# Patient Record
Sex: Female | Born: 1975
Health system: Southern US, Community
[De-identification: ages and names within clinical notes are randomized; demographics above are authoritative.]

## PROBLEM LIST (undated history)

## (undated) DIAGNOSIS — K219 Gastro-esophageal reflux disease without esophagitis: Secondary | ICD-10-CM

## (undated) DIAGNOSIS — Z8619 Personal history of other infectious and parasitic diseases: Secondary | ICD-10-CM

## (undated) HISTORY — DX: Personal history of other infectious and parasitic diseases: Z86.19

## (undated) HISTORY — PX: WISDOM TOOTH EXTRACTION: SHX21

## (undated) HISTORY — DX: Gastro-esophageal reflux disease without esophagitis: K21.9

---

## 2001-11-14 ENCOUNTER — Other Ambulatory Visit: Admission: RE | Admit: 2001-11-14 | Discharge: 2001-11-14 | Payer: Self-pay | Admitting: Obstetrics and Gynecology

## 2001-12-25 ENCOUNTER — Encounter: Payer: Self-pay | Admitting: Obstetrics and Gynecology

## 2001-12-25 ENCOUNTER — Ambulatory Visit (HOSPITAL_COMMUNITY): Admission: RE | Admit: 2001-12-25 | Discharge: 2001-12-25 | Payer: Self-pay | Admitting: Obstetrics and Gynecology

## 2002-01-21 ENCOUNTER — Encounter: Payer: Self-pay | Admitting: Obstetrics and Gynecology

## 2002-01-21 ENCOUNTER — Ambulatory Visit (HOSPITAL_COMMUNITY): Admission: RE | Admit: 2002-01-21 | Discharge: 2002-01-21 | Payer: Self-pay | Admitting: Obstetrics and Gynecology

## 2002-05-19 ENCOUNTER — Inpatient Hospital Stay (HOSPITAL_COMMUNITY): Admission: AD | Admit: 2002-05-19 | Discharge: 2002-05-22 | Payer: Self-pay | Admitting: Obstetrics and Gynecology

## 2002-05-23 ENCOUNTER — Encounter: Admission: RE | Admit: 2002-05-23 | Discharge: 2002-06-22 | Payer: Self-pay | Admitting: Obstetrics and Gynecology

## 2002-11-27 ENCOUNTER — Other Ambulatory Visit: Admission: RE | Admit: 2002-11-27 | Discharge: 2002-11-27 | Payer: Self-pay | Admitting: Obstetrics and Gynecology

## 2003-12-24 ENCOUNTER — Other Ambulatory Visit: Admission: RE | Admit: 2003-12-24 | Discharge: 2003-12-24 | Payer: Self-pay | Admitting: Obstetrics and Gynecology

## 2004-06-03 ENCOUNTER — Ambulatory Visit: Payer: Self-pay | Admitting: Professional

## 2004-06-14 ENCOUNTER — Ambulatory Visit: Payer: Self-pay | Admitting: Professional

## 2004-06-21 ENCOUNTER — Ambulatory Visit: Payer: Self-pay | Admitting: Professional

## 2004-06-28 ENCOUNTER — Ambulatory Visit: Payer: Self-pay | Admitting: Professional

## 2004-07-05 ENCOUNTER — Ambulatory Visit: Payer: Self-pay | Admitting: Professional

## 2004-07-12 ENCOUNTER — Ambulatory Visit: Payer: Self-pay | Admitting: Professional

## 2004-07-22 ENCOUNTER — Ambulatory Visit: Payer: Self-pay | Admitting: Professional

## 2005-01-07 ENCOUNTER — Other Ambulatory Visit: Admission: RE | Admit: 2005-01-07 | Discharge: 2005-01-07 | Payer: Self-pay | Admitting: Obstetrics and Gynecology

## 2011-03-07 ENCOUNTER — Encounter: Payer: Self-pay | Admitting: *Deleted

## 2011-03-07 ENCOUNTER — Emergency Department (HOSPITAL_BASED_OUTPATIENT_CLINIC_OR_DEPARTMENT_OTHER)
Admission: EM | Admit: 2011-03-07 | Discharge: 2011-03-07 | Disposition: A | Discharge status: Home / Self Care | Payer: No Typology Code available for payment source | Attending: Emergency Medicine | Admitting: Emergency Medicine

## 2011-03-07 DIAGNOSIS — Y92410 Unspecified street and highway as the place of occurrence of the external cause: Secondary | ICD-10-CM | POA: Insufficient documentation

## 2011-03-07 DIAGNOSIS — M549 Dorsalgia, unspecified: Secondary | ICD-10-CM | POA: Insufficient documentation

## 2011-03-07 MED ORDER — ONDANSETRON 4 MG PO TBDP
4.0000 mg | ORAL_TABLET | Freq: Once | ORAL | Status: AC
  Administered 2011-03-07: 4 mg via ORAL
  Filled 2011-03-07: qty 1

## 2011-03-07 MED ORDER — HYDROCODONE-ACETAMINOPHEN 5-500 MG PO TABS: 1.0000 | ORAL_TABLET | Freq: Four times a day (QID) | ORAL | Status: AC | PRN

## 2011-03-07 MED ORDER — CYCLOBENZAPRINE HCL 5 MG PO TABS: 5.0000 mg | ORAL_TABLET | Freq: Two times a day (BID) | ORAL | Status: AC | PRN
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
## 2014-08-18 ENCOUNTER — Emergency Department (HOSPITAL_BASED_OUTPATIENT_CLINIC_OR_DEPARTMENT_OTHER)
Admission: EM | Admit: 2014-08-18 | Discharge: 2014-08-19 | Disposition: A | Discharge status: Home / Self Care | Payer: No Typology Code available for payment source | Attending: Emergency Medicine | Admitting: Emergency Medicine

## 2014-08-18 ENCOUNTER — Encounter (HOSPITAL_BASED_OUTPATIENT_CLINIC_OR_DEPARTMENT_OTHER): Payer: Self-pay | Admitting: *Deleted

## 2014-08-18 DIAGNOSIS — Y9389 Activity, other specified: Secondary | ICD-10-CM | POA: Diagnosis not present

## 2014-08-18 DIAGNOSIS — S0990XA Unspecified injury of head, initial encounter: Secondary | ICD-10-CM | POA: Diagnosis not present

## 2014-08-18 DIAGNOSIS — Y998 Other external cause status: Secondary | ICD-10-CM | POA: Diagnosis not present

## 2014-08-18 DIAGNOSIS — S199XXA Unspecified injury of neck, initial encounter: Secondary | ICD-10-CM | POA: Diagnosis not present

## 2014-08-18 DIAGNOSIS — Z87891 Personal history of nicotine dependence: Secondary | ICD-10-CM | POA: Diagnosis not present

## 2014-08-18 DIAGNOSIS — S3992XA Unspecified injury of lower back, initial encounter: Secondary | ICD-10-CM | POA: Insufficient documentation

## 2014-08-18 DIAGNOSIS — Z880 Allergy status to penicillin: Secondary | ICD-10-CM | POA: Diagnosis not present

## 2014-08-18 DIAGNOSIS — Y92410 Unspecified street and highway as the place of occurrence of the external cause: Secondary | ICD-10-CM | POA: Diagnosis not present

## 2014-08-18 DIAGNOSIS — Z3202 Encounter for pregnancy test, result negative: Secondary | ICD-10-CM | POA: Diagnosis not present
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
## 2014-08-19 ENCOUNTER — Emergency Department (HOSPITAL_BASED_OUTPATIENT_CLINIC_OR_DEPARTMENT_OTHER): Payer: No Typology Code available for payment source

## 2014-08-19 LAB — PREGNANCY, URINE: Preg Test, Ur: NEGATIVE

## 2014-08-19 MED ORDER — HYDROCODONE-ACETAMINOPHEN 5-325 MG PO TABS: 1.0000 | ORAL_TABLET | Freq: Four times a day (QID) | ORAL | Status: DC | PRN

## 2014-08-19 NOTE — Discharge Instructions (Signed)
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
## 2016-05-02 ENCOUNTER — Encounter: Payer: Self-pay | Admitting: Physician Assistant

## 2016-05-02 ENCOUNTER — Ambulatory Visit (INDEPENDENT_AMBULATORY_CARE_PROVIDER_SITE_OTHER): Payer: BLUE CROSS/BLUE SHIELD | Admitting: Physician Assistant

## 2016-05-02 VITALS — BP 112/80 | HR 73 | Temp 98.6°F | Resp 14 | Ht 65.25 in | Wt 199.0 lb

## 2016-05-02 DIAGNOSIS — Z0001 Encounter for general adult medical examination with abnormal findings: Secondary | ICD-10-CM | POA: Diagnosis not present

## 2016-05-02 DIAGNOSIS — G8929 Other chronic pain: Secondary | ICD-10-CM | POA: Diagnosis not present

## 2016-05-02 DIAGNOSIS — Z0000 Encounter for general adult medical examination without abnormal findings: Secondary | ICD-10-CM | POA: Insufficient documentation

## 2016-05-02 DIAGNOSIS — K219 Gastro-esophageal reflux disease without esophagitis: Secondary | ICD-10-CM | POA: Diagnosis not present

## 2016-05-02 DIAGNOSIS — Z23 Encounter for immunization: Secondary | ICD-10-CM

## 2016-05-02 DIAGNOSIS — R1031 Right lower quadrant pain: Secondary | ICD-10-CM | POA: Diagnosis not present

## 2016-05-02 LAB — URINALYSIS, ROUTINE W REFLEX MICROSCOPIC
Bilirubin Urine: NEGATIVE
Hgb urine dipstick: NEGATIVE
KETONES UR: NEGATIVE
Leukocytes, UA: NEGATIVE
Nitrite: NEGATIVE
RBC / HPF: NONE SEEN (ref 0–?)
Specific Gravity, Urine: 1.02 (ref 1.000–1.030)
Total Protein, Urine: NEGATIVE
Urine Glucose: NEGATIVE
Urobilinogen, UA: 0.2 (ref 0.0–1.0)
WBC UA: NONE SEEN (ref 0–?)
pH: 5 (ref 5.0–8.0)

## 2016-05-02 LAB — HEMOGLOBIN A1C: Hgb A1c MFr Bld: 5 % (ref 4.6–6.5)

## 2016-05-02 LAB — TSH: TSH: 1.29 u[IU]/mL (ref 0.35–4.50)

## 2016-05-02 LAB — LIPID PANEL
CHOLESTEROL: 145 mg/dL (ref 0–200)
HDL: 52 mg/dL (ref >39.00)
LDL Cholesterol: 75 mg/dL (ref 0–99)
NonHDL: 92.89
Total CHOL/HDL Ratio: 3
Triglycerides: 90 mg/dL (ref 0.0–149.0)
VLDL: 18 mg/dL (ref 0.0–40.0)

## 2016-05-02 LAB — COMPREHENSIVE METABOLIC PANEL
ALT: 20 U/L (ref 0–35)
AST: 16 U/L (ref 0–37)
Albumin: 4 g/dL (ref 3.5–5.2)
Alkaline Phosphatase: 87 U/L (ref 39–117)
BILIRUBIN TOTAL: 0.4 mg/dL (ref 0.2–1.2)
BUN: 10 mg/dL (ref 6–23)
CALCIUM: 9.2 mg/dL (ref 8.4–10.5)
CO2: 28 mEq/L (ref 19–32)
Chloride: 104 mEq/L (ref 96–112)
Creatinine, Ser: 0.6 mg/dL (ref 0.40–1.20)
GFR: 118.19 mL/min (ref >60.00)
Glucose, Bld: 91 mg/dL (ref 70–99)
POTASSIUM: 3.9 meq/L (ref 3.5–5.1)
Sodium: 138 mEq/L (ref 135–145)
TOTAL PROTEIN: 7.1 g/dL (ref 6.0–8.3)

## 2016-05-02 LAB — CBC
HEMATOCRIT: 37.9 % (ref 36.0–46.0)
Hemoglobin: 12.8 g/dL (ref 12.0–15.0)
MCHC: 33.9 g/dL (ref 30.0–36.0)
MCV: 90.2 fl (ref 78.0–100.0)
PLATELETS: 325 10*3/uL (ref 150.0–400.0)
RBC: 4.2 Mil/uL (ref 3.87–5.11)
RDW: 13.9 % (ref 11.5–15.5)
WBC: 7.6 10*3/uL (ref 4.0–10.5)

## 2016-05-02 MED ORDER — PANTOPRAZOLE SODIUM 40 MG PO TBEC: 40.0000 mg | DELAYED_RELEASE_TABLET | Freq: Every day | ORAL | 3 refills | Status: DC

## 2016-05-02 NOTE — Progress Notes (Signed)
Patient presents to clinic today to establish care. Patient is requesting CPE with fasting labs.  Body mass index is 32.86 kg/m. Is not currently involved in exercise but is trying to get back into a regular regimen. Eats a well-balanced diet. Endorses staying well-hydrated.   Acute Concerns: Patient notes several months of pressure/movement in R inguinal region with standing. Denies noticeable bulge or lump. Notes occasional twinge of pain in this area. Resolving quickly. Denies nausea or vomiting, fever or chills. Has noted constipation over the past few months, having to strain for BM. Notes BM every 2-3 days. Denies hematochezia, melena or tenesmus.  Has seen [facility] with examination negative for large hernia. Was told if a small hernia was present they may not be able to feel and imaging would be needed. Has also had Gynecology assessment with normal assessment and pelvic US per patient. Patient has noted increased heart burn over the past 2 weeks, occurring almost daily. Again denies epigastric pain, nausea or vomiting.   Health Maintenance: Immunizations -- Declines flu shot. Tetanus due. Agrees to have today. PAP -- Followed by GYN Joy Keto([facility]). IUD in place. Last PAP 02/2016.   Past Medical History:  Diagnosis Date  . History of chickenpox     Past Surgical History:  Procedure Laterality Date  . WISDOM TOOTH EXTRACTION      No current outpatient prescriptions on file prior to visit.   No current facility-administered medications on file prior to visit.     Allergies  Allergen Reactions  . Penicillins Swelling  . Sulfa Antibiotics Itching    Family History  Problem Relation Age of Onset  . Healthy Mother   . Hypertension Father   . Cancer Father     Esophageal  . Hypertension Brother   . Diabetes Brother   . Diabetes Maternal Grandmother   . Heart attack Maternal Grandfather 55  . Diabetes Paternal Grandmother   . Heart attack Paternal  Grandfather [phone]  . Healthy Daughter     Social History   Social History  . Marital status: Legally Separated    Spouse name: Joy Burns  . Number of children: 1  . Years of education: 9312   Occupational History  . HR Specialist    Social History Main Topics  . Smoking status: Former Games developermoker  . Smokeless tobacco: Never Used  . Alcohol use No  . Drug use: No  . Sexual activity: Yes    Birth control/ protection: IUD     Comment: Mirena   Other Topics Concern  . Not on file   Social History Narrative  . No narrative on file   Review of Systems  Constitutional: Negative for fever and weight loss.  HENT: Negative for ear discharge, ear pain, hearing loss and tinnitus.   Eyes: Negative for blurred vision, double vision, photophobia and pain.  Respiratory: Negative for cough and shortness of breath.   Cardiovascular: Negative for chest pain and palpitations.  Gastrointestinal: Positive for abdominal pain and heartburn. Negative for blood in stool, constipation, diarrhea, melena, nausea and vomiting.  Genitourinary: Negative for dysuria, flank pain, frequency, hematuria and urgency.  Musculoskeletal: Negative for falls.  Neurological: Negative for dizziness, loss of consciousness and headaches.  Endo/Heme/Allergies: Negative for environmental allergies.  Psychiatric/Behavioral: Negative for depression, hallucinations, substance abuse and suicidal ideas. The patient is not nervous/anxious and does not have insomnia.    BP 112/80   Pulse 73   Temp 98.6 F (37 C) (Oral)  Resp 14   Ht 5' 5.25" (1.657 m)   Wt 199 lb (90.3 kg)   SpO2 98%   BMI 32.86 kg/m   Physical Exam  Constitutional: She is oriented to person, place, and time and well-developed, well-nourished, and in no distress.  HENT:  Head: Normocephalic and atraumatic.  Eyes: Conjunctivae are normal.  Neck: Neck supple.  Cardiovascular: Normal rate, regular rhythm, normal heart sounds and intact distal pulses.     Pulmonary/Chest: Effort normal and breath sounds normal. No respiratory distress. She has no wheezes. She has no rales. She exhibits no tenderness.  Abdominal: Soft. Bowel sounds are normal. She exhibits no distension and no mass. There is no tenderness. There is no rebound and no guarding.  Neurological: She is alert and oriented to person, place, and time.  Skin: Skin is warm and dry. No rash noted.  Psychiatric: Affect normal.  Vitals reviewed.  Assessment/Plan: Visit for preventive health examination Depression screen negative. Health Maintenance reviewed -- TDaP updated today. Declines flu shot. [name]. Preventive schedule discussed and handout given in AVS. Will obtain fasting labs today.   Gastroesophageal reflux disease without esophagitis Start probiotic, protonix and GERD diet. Work on exercise to promote weight loss. FU 2 weeks.   Chronic RLQ pain Unclear. May be constipation related. Labs today. Bowel regimen given. Referral to GI placed for further assessment.     [name], PA-C

## 2016-05-02 NOTE — Patient Instructions (Signed)
Please go to the lab for blood work.   Our office will call you with your results unless you have chosen to receive results via MyChart.  If your blood work is normal we will follow-up each year for physicals and as scheduled for chronic medical problems.  If anything is abnormal we will treat accordingly and get you in for a follow-up.  Please start the Protonix as directed. Avoid late-night eating.  See diet below.  You will be contacted for further assessment by Gastroenterology.  I encourage you to increase hydration and the amount of fiber in your diet.  Start a daily probiotic (Align, Culturelle, Digestive Advantage, etc.). If no bowel movement within 24 hours, take 2 Tbs of Milk of Magnesia in a 4 oz glass of warmed prune juice every 2-3 days to help promote bowel movement. If no results within 24 hours, then repeat above regimen, adding a Dulcolax stool softener to regimen. If this does not promote a bowel movement, please call the office.   Food Choices for Gastroesophageal Reflux Disease, Adult When you have gastroesophageal reflux disease (GERD), the foods you eat and your eating habits are very important. Choosing the right foods can help ease your discomfort. What guidelines do I need to follow?  Choose fruits, vegetables, whole grains, and low-fat dairy products.  Choose low-fat meat, fish, and poultry.  Limit fats such as oils, salad dressings, butter, nuts, and avocado.  Keep a food diary. This helps you identify foods that cause symptoms.  Avoid foods that cause symptoms. These may be different for everyone.  Eat small meals often instead of 3 large meals a day.  Eat your meals slowly, in a place where you are relaxed.  Limit fried foods.  Cook foods using methods other than frying.  Avoid drinking alcohol.  Avoid drinking large amounts of liquids with your meals.  Avoid bending over or lying down until 2-3 hours after eating. What foods are not  recommended? These are some foods and drinks that may make your symptoms worse: Vegetables  Tomatoes. Tomato juice. Tomato and spaghetti sauce. Chili peppers. Onion and garlic. Horseradish. Fruits  Oranges, grapefruit, and lemon (fruit and juice). Meats  High-fat meats, fish, and poultry. This includes hot dogs, ribs, ham, sausage, salami, and bacon. Dairy  Whole milk and chocolate milk. Sour cream. Cream. Butter. Ice cream. Cream cheese. Drinks  Coffee and tea. Bubbly (carbonated) drinks or energy drinks. Condiments  Hot sauce. Barbecue sauce. Sweets/Desserts  Chocolate and cocoa. Donuts. Peppermint and spearmint. Fats and Oils  High-fat foods. This includes Jamaica[place] fries and potato chips. Other  Vinegar. Strong spices. This includes black pepper, white pepper, red pepper, cayenne, curry powder, cloves, ginger, and chili powder. The items listed above may not be a complete list of foods and drinks to avoid. Contact your dietitian for more information.  This information is not intended to replace advice given to you by your health care provider. Make sure you discuss any questions you have with your health care provider. Document Released: 09/13/2011 Document Revised: 08/20/2015 Document Reviewed: 01/16/2013 Elsevier Interactive Patient Education  2017 [employer]    .

## 2016-05-02 NOTE — Assessment & Plan Note (Signed)
Start probiotic, protonix and GERD diet. Work on exercise to promote weight loss. FU 2 weeks.

## 2016-05-02 NOTE — Progress Notes (Signed)
Pre visit review using our clinic review tool, if applicable. No additional management support is needed unless otherwise documented below in the visit note. 

## 2016-05-02 NOTE — Assessment & Plan Note (Signed)
Unclear. May be constipation related. Labs today. Bowel regimen given. Referral to GI placed for further assessment.

## 2016-05-02 NOTE — Assessment & Plan Note (Signed)
Depression screen negative. Health Maintenance reviewed -- TDaP updated today. Declines flu shot. [name]. Preventive schedule discussed and handout given in AVS. Will obtain fasting labs today.

## 2016-05-03 ENCOUNTER — Encounter: Payer: Self-pay | Admitting: Gastroenterology

## 2016-06-13 ENCOUNTER — Ambulatory Visit: Payer: BLUE CROSS/BLUE SHIELD | Admitting: Gastroenterology

## 2016-07-20 ENCOUNTER — Encounter (INDEPENDENT_AMBULATORY_CARE_PROVIDER_SITE_OTHER): Payer: Self-pay

## 2016-07-20 ENCOUNTER — Encounter: Payer: Self-pay | Admitting: Gastroenterology

## 2016-07-20 ENCOUNTER — Ambulatory Visit (INDEPENDENT_AMBULATORY_CARE_PROVIDER_SITE_OTHER): Payer: BLUE CROSS/BLUE SHIELD | Admitting: Gastroenterology

## 2016-07-20 VITALS — BP 120/78 | HR 88 | Ht 64.0 in | Wt 205.0 lb

## 2016-07-20 DIAGNOSIS — R1319 Other dysphagia: Secondary | ICD-10-CM

## 2016-07-20 DIAGNOSIS — R1031 Right lower quadrant pain: Secondary | ICD-10-CM | POA: Diagnosis not present

## 2016-07-20 DIAGNOSIS — K219 Gastro-esophageal reflux disease without esophagitis: Secondary | ICD-10-CM

## 2016-07-20 DIAGNOSIS — R1310 Dysphagia, unspecified: Secondary | ICD-10-CM

## 2016-07-20 NOTE — Patient Instructions (Signed)
Patient advised to avoid spicy, acidic, citrus, chocolate, mints, fruit and fruit juices.  Limit the intake of caffeine, alcohol and Soda.  Don't exercise too soon after eating.  Don't lie down within 3-4 hours of eating.  Elevate the head of your bed.  You have been scheduled for an endoscopy. Please follow written instructions given to you at your visit today. If you use inhalers (even only as needed), please bring them with you on the day of your procedure. Your physician has requested that you go to www.startemmi.com and enter the access code given to you at your visit today. This web site gives a general overview about your procedure. However, you should still follow specific instructions given to you by our office regarding your preparation for the procedure.  You have been scheduled for a CT scan of the abdomen and pelvis at [facility] ([address]---this is in the same building as Press photographer).   You are scheduled on 07/29/16 at 9:00am. You should arrive 15 minutes prior to your appointment time for registration. Please follow the written instructions below on the day of your exam:  WARNING: IF YOU ARE ALLERGIC TO IODINE/X-RAY DYE, PLEASE NOTIFY RADIOLOGY IMMEDIATELY AT [phone]! YOU WILL BE GIVEN A 13 HOUR PREMEDICATION PREP.  1) Do not eat or drink anything after 5:00am (4 hours prior to your test) 2) You have been given 2 bottles of oral contrast to drink. The solution may taste               better if refrigerated, but do NOT add ice or any other liquid to this solution. Shake             well before drinking.    Drink 1 bottle of contrast @ 7:00am (2 hours prior to your exam)  Drink 1 bottle of contrast @ 8:00am (1 hour prior to your exam)  You may take any medications as prescribed with a small amount of water except for the following: Metformin, Glucophage, Glucovance, Avandamet, Riomet, Fortamet, Actoplus Met, Janumet, Glumetza or Metaglip. The above  medications must be held the day of the exam AND 48 hours after the exam.  The purpose of you drinking the oral contrast is to aid in the visualization of your intestinal tract. The contrast solution may cause some diarrhea. Before your exam is started, you will be given a small amount of fluid to drink. Depending on your individual set of symptoms, you may also receive an intravenous injection of x-ray contrast/dye. Plan on being at [facility] for 30 minutes or longer, depending on the type of exam you are having performed.  This test typically takes 30-45 minutes to complete.  If you have any questions regarding your exam or if you need to reschedule, you may call the CT department at [phone] between the hours of 8:00 am and 5:00 pm, Monday-Friday.  ________________________________________________________________________  Normal BMI (Body Mass Index- based on height and weight) is between 19 and 25. Your BMI today is Body mass index is 35.19 kg/m. Marland Kitchen Please consider follow up  regarding your BMI with your Primary Care Provider.  Thank you for choosing me and [facility].  Pricilla Riffle. Dagoberto Ligas., MD., Marval Regal

## 2016-07-20 NOTE — Progress Notes (Signed)
History of Present Illness: This is a 41 year old female referred by Waldon Merl, PA-C for the evaluation of for intermittent RLQ pain for 6-8 months. Symptoms occur when she changes to a standing position. Symptoms do not appear to be associated with bowel movements or meals. Symptoms occur frequently but not every day. She had evaluations by [facility] and GYN and apparently both evaluations were negative including pelvic US however I do not have those records. Mild heartburn and mild intermittent constipation. Her GERD was controlled with pantoprazole however she discontinued it and would like to resume. Dysphagia intermittently with solid foods for the past year. Symptoms improved while she was taking pantoprazole. Occasional mild constipation that is not currently bothersome. CMP, CBC, TSH were normal in 04/2016. Denies weight loss, diarrhea, change in stool caliber, melena, hematochezia, nausea, vomiting, chest pain.    Allergies  Allergen Reactions  . Penicillins Swelling  . Sulfa Antibiotics Itching   Outpatient Medications Prior to Visit  Medication Sig Dispense Refill  . levonorgestrel (MIRENA) 20 MCG/24HR IUD 1 each by Intrauterine route once.    . pantoprazole (PROTONIX) 40 MG tablet Take 1 tablet (40 mg total) by mouth daily. 30 tablet 3   No facility-administered medications prior to visit.    Past Medical History:  Diagnosis Date  . GERD (gastroesophageal reflux disease)   . History of chickenpox    Past Surgical History:  Procedure Laterality Date  . WISDOM TOOTH EXTRACTION     Social History   Social History  . Marital status: Legally Separated    Spouse name: Micheal  . Number of children: 1  . Years of education: 97   Occupational History  . HR Specialist    Social History Main Topics  . Smoking status: Former Games developer  . Smokeless tobacco: Never Used  . Alcohol use No  . Drug use: No  . Sexual activity: Yes    Birth control/ protection: IUD   Comment: Mirena   Other Topics Concern  . None   Social History Narrative  . None   Family History  Problem Relation Age of Onset  . Healthy Mother   . Hypertension Father   . Cancer Father     Esophageal  . Hypertension Brother   . Diabetes Brother   . Diabetes Maternal Grandmother   . Heart attack Maternal Grandfather 55  . Diabetes Paternal Grandmother   . Heart attack Paternal Grandfather 7  . Healthy Daughter       Review of Systems: Pertinent positive and negative review of systems were noted in the above HPI section. All other review of systems were otherwise negative.   Physical Exam: General: Well developed, well nourished, no acute distress Head: Normocephalic and atraumatic Eyes:  sclerae anicteric, EOMI Ears: Normal auditory acuity Mouth: No deformity or lesions Neck: Supple, no masses or thyromegaly Lungs: Clear throughout to auscultation Heart: Regular rate and rhythm; no murmurs, rubs or bruits Abdomen: Soft, non tender and non distended. No masses, hepatosplenomegaly or hernias noted. Normal Bowel sounds Musculoskeletal: Symmetrical with no gross deformities  Skin: No lesions on visible extremities Pulses:  Normal pulses noted Extremities: No clubbing, cyanosis, edema or deformities noted Neurological: Alert oriented x 4, grossly nonfocal Cervical Nodes:  No significant cervical adenopathy Inguinal Nodes: No significant inguinal adenopathy Psychological:  Alert and cooperative. Normal mood and affect  Assessment and Recommendations:  1. RLQ pain. Symptoms intermittent and related to standing. No hernias palpable. Rule out hernia, musculoskeletal, pelvic  and GI disorders. Schedule abdominal/pelvic CT scan. Request records from [facility] and GYN evaluations.  2. GERD and dysphagia. Rule out esophagitis, stricture and other disorders. Resume pantoprazole 40 mg every morning and follow standard antireflux measures. Schedule EGD. The risks (including bleeding,  perforation, infection, missed lesions, medication reactions and possible hospitalization or surgery if complications occur), benefits, and alternatives to endoscopy with possible biopsy and possible dilation were discussed with the patient and they consent to proceed.     cc: Waldon Merl, PA-C [address] [place], [place] [address]

## 2016-07-29 ENCOUNTER — Telehealth: Payer: Self-pay | Admitting: Gastroenterology

## 2016-07-29 ENCOUNTER — Other Ambulatory Visit: Payer: Self-pay

## 2016-07-29 ENCOUNTER — Ambulatory Visit (INDEPENDENT_AMBULATORY_CARE_PROVIDER_SITE_OTHER)
Admission: RE | Admit: 2016-07-29 | Discharge: 2016-07-29 | Disposition: A | Discharge status: Home / Self Care | Payer: BLUE CROSS/BLUE SHIELD | Source: Ambulatory Visit | Attending: Gastroenterology | Admitting: Gastroenterology

## 2016-07-29 DIAGNOSIS — R1031 Right lower quadrant pain: Secondary | ICD-10-CM | POA: Diagnosis not present

## 2016-07-29 MED ORDER — IOPAMIDOL (ISOVUE-300) INJECTION 61%
100.0000 mL | Freq: Once | INTRAVENOUS | Status: AC | PRN
  Administered 2016-07-29: 100 mL via INTRAVENOUS

## 2016-08-01 NOTE — Telephone Encounter (Signed)
[facility], Dr. Aundria Rudogers for 08/09/16 8:30 arrival for 9:00 appt.  Patient notified of the appt date and time.  Records faxed to [phone]

## 2016-08-05 ENCOUNTER — Encounter: Payer: Self-pay | Admitting: Gastroenterology

## 2016-08-08 ENCOUNTER — Other Ambulatory Visit: Payer: Self-pay | Admitting: Emergency Medicine

## 2016-08-08 DIAGNOSIS — K219 Gastro-esophageal reflux disease without esophagitis: Secondary | ICD-10-CM

## 2016-08-08 MED ORDER — PANTOPRAZOLE SODIUM 40 MG PO TBEC: 40.0000 mg | DELAYED_RELEASE_TABLET | Freq: Every day | ORAL | 0 refills | Status: DC

## 2016-08-19 ENCOUNTER — Encounter: Payer: Self-pay | Admitting: Gastroenterology

## 2016-08-19 ENCOUNTER — Ambulatory Visit (AMBULATORY_SURGERY_CENTER): Payer: BLUE CROSS/BLUE SHIELD | Admitting: Gastroenterology

## 2016-08-19 VITALS — BP 134/89 | HR 77 | Temp 98.9°F | Resp 11 | Ht 64.0 in | Wt 205.0 lb

## 2016-08-19 DIAGNOSIS — K222 Esophageal obstruction: Secondary | ICD-10-CM

## 2016-08-19 DIAGNOSIS — R1310 Dysphagia, unspecified: Secondary | ICD-10-CM

## 2016-08-19 DIAGNOSIS — K219 Gastro-esophageal reflux disease without esophagitis: Secondary | ICD-10-CM

## 2016-08-19 MED ORDER — SODIUM CHLORIDE 0.9 % IV SOLN: 500.0000 mL | INTRAVENOUS | Status: DC

## 2016-08-19 NOTE — Patient Instructions (Signed)
YOU HAD AN ENDOSCOPIC PROCEDURE TODAY AT THE [facility]:   Refer to the procedure report that was given to you for any specific questions about what was found during the examination.  If the procedure report does not answer your questions, please call your gastroenterologist to clarify.  If you requested that your care partner not be given the details of your procedure findings, then the procedure report has been included in a sealed envelope for you to review at your convenience later.  YOU SHOULD EXPECT: Some feelings of bloating in the abdomen. Passage of more gas than usual.  Walking can help get rid of the air that was put into your GI tract during the procedure and reduce the bloating. If you had a lower endoscopy (such as a colonoscopy or flexible sigmoidoscopy) you may notice spotting of blood in your stool or on the toilet paper. If you underwent a bowel prep for your procedure, you may not have a normal bowel movement for a few days.  Please Note:  You might notice some irritation and congestion in your nose or some drainage.  This is from the oxygen used during your procedure.  There is no need for concern and it should clear up in a day or so.  SYMPTOMS TO REPORT IMMEDIATELY:   Following upper endoscopy (EGD)  Vomiting of blood or coffee ground material  New chest pain or pain under the shoulder blades  Painful or persistently difficult swallowing  New shortness of breath  Fever of 100F or higher  Black, tarry-looking stools  For urgent or emergent issues, a gastroenterologist can be reached at any hour by calling [phone].   DIET:  Clear liquid diet for 2 hours., then advance as tolerated to soft diet for the remainder of today. Resume prior diet tomorrow. Drink plenty of fluids but you should avoid alcoholic beverages for 24 hours.  MEDICATIONS: Continue present medications.  Please see handouts given to you by your recovery nurse.  ACTIVITY:  You should  plan to take it easy for the rest of today and you should NOT DRIVE or use heavy machinery until tomorrow (because of the sedation medicines used during the test).    FOLLOW UP:  GI office appointment in 1 year.  Our staff will call the number listed on your records the next business day following your procedure to check on you and address any questions or concerns that you may have regarding the information given to you following your procedure. If we do not reach you, we will leave a message.  However, if you are feeling well and you are not experiencing any problems, there is no need to return our call.  We will assume that you have returned to your regular daily activities without incident.  If any biopsies were taken you will be contacted by phone or by letter within the next 1-3 weeks.  Please call us at [phone] if you have not heard about the biopsies in 3 weeks.   Thank you for allowing us to provide for your healthcare needs today.   SIGNATURES/CONFIDENTIALITY: You and/or your care partner have signed paperwork which will be entered into your electronic medical record.  These signatures attest to the fact that that the information above on your After Visit Summary has been reviewed and is understood.  Full responsibility of the confidentiality of this discharge information lies with you and/or your care-partner.

## 2016-08-19 NOTE — Progress Notes (Signed)
Called to room to assist during endoscopic procedure.  Patient ID and intended procedure confirmed with present staff. Received instructions for my participation in the procedure from the performing physician.  

## 2016-08-19 NOTE — Progress Notes (Signed)
Pt's states no medical or surgical changes since previsit or office visit. 

## 2016-08-19 NOTE — Op Note (Signed)
[facility] Patient Name: Joy FillJennifer Glennie Procedure Date: 08/19/2016 9:58 AM MRN: [id number] Endoscopist: Meryl DareMalcolm T Alanya Vukelich , MD Age: [age] Referring MD:  Date of Birth: 08/13/1975 Gender: Female Account #: [id number] Procedure:                Upper GI endoscopy Indications:              Dysphagia, Gastro-esophageal reflux disease Medicines:                Monitored Anesthesia Care Procedure:                Pre-Anesthesia Assessment:                           - Prior to the procedure, a History and Physical                            was performed, and patient medications and                            allergies were reviewed. The patient's tolerance of                            previous anesthesia was also reviewed. The risks                            and benefits of the procedure and the sedation                            options and risks were discussed with the patient.                            All questions were answered, and informed consent                            was obtained. Prior Anticoagulants: The patient has                            taken no previous anticoagulant or antiplatelet                            agents. ASA Grade Assessment: II - A patient with                            mild systemic disease. After reviewing the risks                            and benefits, the patient was deemed in                            satisfactory condition to undergo the procedure.                           After obtaining informed consent, the endoscope was  passed under direct vision. Throughout the                            procedure, the patient's blood pressure, pulse, and                            oxygen saturations were monitored continuously. The                            Endoscope was introduced through the mouth, and                            advanced to the second part of duodenum. The upper                            GI  endoscopy was accomplished without difficulty.                            The patient tolerated the procedure well. Scope In: Scope Out: Findings:                 One moderate benign-appearing, intrinsic stenosis                            was found at the gastroesophageal junction. This                            measured 1.3 cm (inner diameter) and was traversed.                            A guidewire was placed and the scope was withdrawn.                            Dilation was performed with a Savary dilator with                            no resistance at 14 mm. Dilations were performed                            with Savary dilators with mild resistance at 15 mm                            and 16 mm. No heme noted.                           The exam of the esophagus was otherwise normal.                           The entire examined stomach was normal.                           The duodenal bulb and second portion of the  duodenum were normal. Complications:            No immediate complications. Estimated Blood Loss:     Estimated blood loss: none. Impression:               - Benign-appearing esophageal stenosis. Dilated.                           - Normal stomach.                           - Normal duodenal bulb and second portion of the                            duodenum.                           - No specimens collected. Recommendation:           - Patient has a contact number available for                            emergencies. The signs and symptoms of potential                            delayed complications were discussed with the                            patient. Return to normal activities tomorrow.                            Written discharge instructions were provided to the                            patient.                           - Clear liquid diet for 2 hours, then advance as                            tolerated to soft diet  today. Resume prior diet                            tomorrow.                           - Continue present medications.                           - GI office appt in 1 year. Meryl Dare, MD 08/19/2016 10:14:51 AM This report has been signed electronically.

## 2016-08-19 NOTE — Progress Notes (Signed)
Report to PACU, RN, vss, BBS= Clear.  

## 2016-08-23 ENCOUNTER — Telehealth: Payer: Self-pay

## 2016-08-23 NOTE — Telephone Encounter (Signed)
  Follow up Call-  Call back number 08/19/2016  Post procedure Call Back phone  # [phone]  Permission to leave phone message Yes  Some recent data might be hidden     Patient questions:  Do you have a fever, pain , or abdominal swelling? No. Pain Score  0 *  Have you tolerated food without any problems? Yes.    Have you been able to return to your normal activities? Yes.    Do you have any questions about your discharge instructions: Diet   No. Medications  No. Follow up visit  No.  Do you have questions or concerns about your Care? No.  Actions: * If pain score is 4 or above: No action needed, pain <4.

## 2016-11-11 ENCOUNTER — Other Ambulatory Visit: Payer: Self-pay | Admitting: Physician Assistant

## 2016-11-11 DIAGNOSIS — K219 Gastro-esophageal reflux disease without esophagitis: Secondary | ICD-10-CM

## 2017-04-17 DIAGNOSIS — Z30431 Encounter for routine checking of intrauterine contraceptive device: Secondary | ICD-10-CM | POA: Diagnosis not present

## 2017-04-17 DIAGNOSIS — Z6837 Body mass index (BMI) 37.0-37.9, adult: Secondary | ICD-10-CM | POA: Diagnosis not present

## 2017-04-17 DIAGNOSIS — Z130 Encounter for screening for diseases of the blood and blood-forming organs and certain disorders involving the immune mechanism: Secondary | ICD-10-CM | POA: Diagnosis not present

## 2017-04-17 DIAGNOSIS — Z01419 Encounter for gynecological examination (general) (routine) without abnormal findings: Secondary | ICD-10-CM | POA: Diagnosis not present

## 2017-04-17 DIAGNOSIS — Z1389 Encounter for screening for other disorder: Secondary | ICD-10-CM | POA: Diagnosis not present

## 2017-04-17 DIAGNOSIS — Z1231 Encounter for screening mammogram for malignant neoplasm of breast: Secondary | ICD-10-CM | POA: Diagnosis not present

## 2017-04-17 DIAGNOSIS — R61 Generalized hyperhidrosis: Secondary | ICD-10-CM | POA: Diagnosis not present

## 2017-04-21 ENCOUNTER — Other Ambulatory Visit: Payer: Self-pay | Admitting: Obstetrics and Gynecology

## 2017-04-21 DIAGNOSIS — R928 Other abnormal and inconclusive findings on diagnostic imaging of breast: Secondary | ICD-10-CM

## 2017-04-26 ENCOUNTER — Ambulatory Visit: Payer: BLUE CROSS/BLUE SHIELD

## 2017-04-26 ENCOUNTER — Ambulatory Visit
Admission: RE | Admit: 2017-04-26 | Discharge: 2017-04-26 | Disposition: A | Discharge status: Home / Self Care | Payer: BLUE CROSS/BLUE SHIELD | Source: Ambulatory Visit | Attending: Obstetrics and Gynecology | Admitting: Obstetrics and Gynecology

## 2017-04-26 DIAGNOSIS — R928 Other abnormal and inconclusive findings on diagnostic imaging of breast: Secondary | ICD-10-CM

## 2017-04-26 DIAGNOSIS — R922 Inconclusive mammogram: Secondary | ICD-10-CM | POA: Diagnosis not present

## 2017-04-26 LAB — HM MAMMOGRAPHY

## 2017-05-17 ENCOUNTER — Encounter: Payer: Self-pay | Admitting: Emergency Medicine

## 2017-05-17 ENCOUNTER — Other Ambulatory Visit: Payer: Self-pay | Admitting: Emergency Medicine

## 2017-05-17 DIAGNOSIS — K219 Gastro-esophageal reflux disease without esophagitis: Secondary | ICD-10-CM

## 2017-05-17 MED ORDER — PANTOPRAZOLE SODIUM 40 MG PO TBEC: 40.0000 mg | DELAYED_RELEASE_TABLET | Freq: Every day | ORAL | 0 refills | Status: DC

## 2017-07-27 ENCOUNTER — Ambulatory Visit (INDEPENDENT_AMBULATORY_CARE_PROVIDER_SITE_OTHER): Payer: BLUE CROSS/BLUE SHIELD | Admitting: Physician Assistant

## 2017-07-27 ENCOUNTER — Other Ambulatory Visit: Payer: Self-pay

## 2017-07-27 ENCOUNTER — Other Ambulatory Visit: Payer: Self-pay | Admitting: Physician Assistant

## 2017-07-27 ENCOUNTER — Encounter: Payer: Self-pay | Admitting: Physician Assistant

## 2017-07-27 VITALS — BP 122/80 | HR 74 | Temp 98.3°F | Resp 14 | Ht 64.0 in | Wt 216.0 lb

## 2017-07-27 DIAGNOSIS — G43809 Other migraine, not intractable, without status migrainosus: Secondary | ICD-10-CM | POA: Diagnosis not present

## 2017-07-27 DIAGNOSIS — L719 Rosacea, unspecified: Secondary | ICD-10-CM

## 2017-07-27 DIAGNOSIS — Z0000 Encounter for general adult medical examination without abnormal findings: Secondary | ICD-10-CM

## 2017-07-27 DIAGNOSIS — G43909 Migraine, unspecified, not intractable, without status migrainosus: Secondary | ICD-10-CM | POA: Insufficient documentation

## 2017-07-27 LAB — CBC WITH DIFFERENTIAL/PLATELET
Basophils Absolute: 0 K/uL (ref 0.0–0.1)
Basophils Relative: 0.5 % (ref 0.0–3.0)
Eosinophils Absolute: 0.1 K/uL (ref 0.0–0.7)
Eosinophils Relative: 1.9 % (ref 0.0–5.0)
HCT: 36.4 % (ref 36.0–46.0)
Hemoglobin: 12.4 g/dL (ref 12.0–15.0)
Lymphocytes Relative: 26.7 % (ref 12.0–46.0)
Lymphs Abs: 2 K/uL (ref 0.7–4.0)
MCHC: 34 g/dL (ref 30.0–36.0)
MCV: 91.4 fl (ref 78.0–100.0)
Monocytes Absolute: 0.6 K/uL (ref 0.1–1.0)
Monocytes Relative: 7.4 % (ref 3.0–12.0)
Neutro Abs: 4.9 K/uL (ref 1.4–7.7)
Neutrophils Relative %: 63.5 % (ref 43.0–77.0)
Platelets: 352 K/uL (ref 150.0–400.0)
RBC: 3.98 Mil/uL (ref 3.87–5.11)
RDW: 12.8 % (ref 11.5–15.5)
WBC: 7.6 K/uL (ref 4.0–10.5)

## 2017-07-27 LAB — TSH: TSH: 1.1 u[IU]/mL (ref 0.35–4.50)

## 2017-07-27 LAB — COMPREHENSIVE METABOLIC PANEL WITH GFR
ALT: 16 U/L (ref 0–35)
AST: 18 U/L (ref 0–37)
Albumin: 4.1 g/dL (ref 3.5–5.2)
Alkaline Phosphatase: 113 U/L (ref 39–117)
BUN: 14 mg/dL (ref 6–23)
CO2: 27 meq/L (ref 19–32)
Calcium: 9.2 mg/dL (ref 8.4–10.5)
Chloride: 103 meq/L (ref 96–112)
Creatinine, Ser: 0.65 mg/dL (ref 0.40–1.20)
GFR: 107.08 mL/min
Glucose, Bld: 87 mg/dL (ref 70–99)
Potassium: 3.9 meq/L (ref 3.5–5.1)
Sodium: 138 meq/L (ref 135–145)
Total Bilirubin: 0.4 mg/dL (ref 0.2–1.2)
Total Protein: 7.2 g/dL (ref 6.0–8.3)

## 2017-07-27 LAB — LIPID PANEL
Cholesterol: 164 mg/dL (ref 0–200)
HDL: 56.2 mg/dL (ref >39.00)
LDL CALC: 89 mg/dL (ref 0–99)
NonHDL: 107.79
Total CHOL/HDL Ratio: 3
Triglycerides: 94 mg/dL (ref 0.0–149.0)
VLDL: 18.8 mg/dL (ref 0.0–40.0)

## 2017-07-27 LAB — HEMOGLOBIN A1C: Hgb A1c MFr Bld: 5.3 % (ref 4.6–6.5)

## 2017-07-27 MED ORDER — HYDROCORTISONE ACETATE 25 MG RE SUPP: 25.0000 mg | Freq: Two times a day (BID) | RECTAL | 0 refills | Status: DC

## 2017-07-27 MED ORDER — SUMATRIPTAN SUCCINATE 25 MG PO TABS: 25.0000 mg | ORAL_TABLET | Freq: Once | ORAL | 0 refills | Status: DC

## 2017-07-27 NOTE — Assessment & Plan Note (Signed)
Continue symptoms journal to assess for potential triggers. Follow-up with GYN regarding Mirena. Rx imitrex given.

## 2017-07-27 NOTE — Assessment & Plan Note (Signed)
Depression screen negative. Health Maintenance reviewed. Preventive schedule discussed and handout given in AVS. Will obtain fasting labs today.  

## 2017-07-27 NOTE — Patient Instructions (Signed)
Please go to the lab for blood work.  Our office will call you with your results unless you have chosen to receive results via MyChart. If your blood work is normal we will follow-up each year for physicals and as scheduled for chronic medical problems. If anything is abnormal we will treat accordingly and get you in for a follow-up.  Follow-up with GYN as scheduled to discuss the mirena. I am setting you up with Dermatology for assessment of the skin changes.  Use the Imitrex as directed for acute headache.    Preventive Care 40-64 Years, Female Preventive care refers to lifestyle choices and visits with your health care provider that can promote health and wellness. What does preventive care include?  A yearly physical exam. This is also called an annual well check.  Dental exams once or twice a year.  Routine eye exams. Ask your health care provider how often you should have your eyes checked.  Personal lifestyle choices, including: ? Daily care of your teeth and gums. ? Regular physical activity. ? Eating a healthy diet. ? Avoiding tobacco and drug use. ? Limiting alcohol use. ? Practicing safe sex. ? Taking low-dose aspirin daily starting at age 50. ? Taking vitamin and mineral supplements as recommended by your health care provider. What happens during an annual well check? The services and screenings done by your health care provider during your annual well check will depend on your age, overall health, lifestyle risk factors, and family history of disease. Counseling Your health care provider may ask you questions about your:  Alcohol use.  Tobacco use.  Drug use.  Emotional well-being.  Home and relationship well-being.  Sexual activity.  Eating habits.  Work and work environment.  Method of birth control.  Menstrual cycle.  Pregnancy history.  Screening You may have the following tests or measurements:  Height, weight, and BMI.  Blood  pressure.  Lipid and cholesterol levels. These may be checked every 5 years, or more frequently if you are over 50 years old.  Skin check.  Lung cancer screening. You may have this screening every year starting at age 55 if you have a 30-pack-year history of smoking and currently smoke or have quit within the past 15 years.  Fecal occult blood test (FOBT) of the stool. You may have this test every year starting at age 50.  Flexible sigmoidoscopy or colonoscopy. You may have a sigmoidoscopy every 5 years or a colonoscopy every 10 years starting at age 50.  Hepatitis C blood test.  Hepatitis B blood test.  Sexually transmitted disease (STD) testing.  Diabetes screening. This is done by checking your blood sugar (glucose) after you have not eaten for a while (fasting). You may have this done every 1-3 years.  Mammogram. This may be done every 1-2 years. Talk to your health care provider about when you should start having regular mammograms. This may depend on whether you have a family history of breast cancer.  BRCA-related cancer screening. This may be done if you have a family history of breast, ovarian, tubal, or peritoneal cancers.  Pelvic exam and Pap test. This may be done every 3 years starting at age 21. Starting at age 30, this may be done every 5 years if you have a Pap test in combination with an HPV test.  Bone density scan. This is done to screen for osteoporosis. You may have this scan if you are at high risk for osteoporosis.  Discuss your test results,   treatment options, and if necessary, the need for more tests with your health care provider. Vaccines Your health care provider may recommend certain vaccines, such as:  Influenza vaccine. This is recommended every year.  Tetanus, diphtheria, and acellular pertussis (Tdap, Td) vaccine. You may need a Td booster every 10 years.  Varicella vaccine. You may need this if you have not been vaccinated.  Zoster vaccine. You  may need this after age 60.  Measles, mumps, and rubella (MMR) vaccine. You may need at least one dose of MMR if you were born in 1957 or later. You may also need a second dose.  Pneumococcal 13-valent conjugate (PCV13) vaccine. You may need this if you have certain conditions and were not previously vaccinated.  Pneumococcal polysaccharide (PPSV23) vaccine. You may need one or two doses if you smoke cigarettes or if you have certain conditions.  Meningococcal vaccine. You may need this if you have certain conditions.  Hepatitis A vaccine. You may need this if you have certain conditions or if you travel or work in places where you may be exposed to hepatitis A.  Hepatitis B vaccine. You may need this if you have certain conditions or if you travel or work in places where you may be exposed to hepatitis B.  Haemophilus influenzae type b (Hib) vaccine. You may need this if you have certain conditions.  Talk to your health care provider about which screenings and vaccines you need and how often you need them. This information is not intended to replace advice given to you by your health care provider. Make sure you discuss any questions you have with your health care provider. Document Released: 04/10/2015 Document Revised: 12/02/2015 Document Reviewed: 01/13/2015 Elsevier Interactive Patient Education  2018 [employer]       

## 2017-07-27 NOTE — Assessment & Plan Note (Signed)
-   Referral to Dermatology placed

## 2017-07-27 NOTE — Progress Notes (Signed)
Patient presents to clinic today for annual exam.  Patient is fasting for labs.  Patient endorses diet that is well-balanced presently. In terms of exercise, patient states she is working on this but there is room for improvement..   Of note, patient has been noting episodic headaches occurring over the past several months. States these are occurring more so around her menstrual cycle time. States since she got the Mirena, she only rarely has her period but the headaches are following her usual menstrual cycle. Patient endorses speaking with her GYN who is having her follow a symptom journal for them to review at follow-up. Denies any change to diet. Is staying well-hydrated. Denies any new major stressors.   Patient also notes an intermittent rash of face, around her nares that is sometimes "acne-like but different". States it comes and goes despite what she uses to clean her face. Avoids scented soaps and lotions. Does not wear make-up most of the time. Rash is not present currently per patient.   Health Maintenance: Immunizations -- Tetanus up-to-date. Mammogram -- up-to-date. 04/26/2017 PAP -- Last 2017. Normal per patient.   Past Medical History:  Diagnosis Date  . GERD (gastroesophageal reflux disease)   . History of chickenpox     Past Surgical History:  Procedure Laterality Date  . WISDOM TOOTH EXTRACTION      Current Outpatient Medications on File Prior to Visit  Medication Sig Dispense Refill  . levonorgestrel (MIRENA) 20 MCG/24HR IUD 1 each by Intrauterine route once.    . pantoprazole (PROTONIX) 40 MG tablet Take 1 tablet (40 mg total) by mouth daily. 30 tablet 0   No current facility-administered medications on file prior to visit.     Allergies  Allergen Reactions  . Penicillins Swelling  . Sulfa Antibiotics Itching    Family History  Problem Relation Age of Onset  . Healthy Mother   . Hypertension Father   . Cancer Father        Esophageal  . Hypertension  Brother   . Diabetes Brother   . Diabetes Maternal Grandmother   . Heart attack Maternal Grandfather 55  . Diabetes Paternal Grandmother   . Heart attack Paternal Grandfather 51  . Healthy Daughter     Social History   Socioeconomic History  . Marital status: Legally Separated    Spouse name: Micheal  . Number of children: 1  . Years of education: 51  . Highest education level: Not on file  Occupational History  . Occupation: HR Specialist  Social Needs  . Financial resource strain: Not on file  . Food insecurity:    Worry: Not on file    Inability: Not on file  . Transportation needs:    Medical: Not on file    Non-medical: Not on file  Tobacco Use  . Smoking status: Former Games developer  . Smokeless tobacco: Never Used  Substance and Sexual Activity  . Alcohol use: No  . Drug use: No  . Sexual activity: Yes    Birth control/protection: IUD    Comment: Mirena  Lifestyle  . Physical activity:    Days per week: Not on file    Minutes per session: Not on file  . Stress: Not on file  Relationships  . Social connections:    Talks on phone: Not on file    Gets together: Not on file    Attends religious service: Not on file    Active member of club or organization: Not on file  Attends meetings of clubs or organizations: Not on file    Relationship status: Not on file  . Intimate partner violence:    Fear of current or ex partner: Not on file    Emotionally abused: Not on file    Physically abused: Not on file    Forced sexual activity: Not on file  Other Topics Concern  . Not on file  Social History Narrative  . Not on file    Review of Systems  Constitutional: Negative for fever and weight loss.  HENT: Negative for ear discharge, ear pain, hearing loss and tinnitus.   Eyes: Negative for blurred vision, double vision, photophobia and pain.  Respiratory: Negative for cough and shortness of breath.   Cardiovascular: Negative for chest pain and palpitations.    Gastrointestinal: Negative for abdominal pain, blood in stool, constipation, diarrhea, heartburn, melena, nausea and vomiting.  Genitourinary: Negative for dysuria, flank pain, frequency, hematuria and urgency.  Musculoskeletal: Negative for falls.  Skin: Positive for rash. Negative for itching.  Neurological: Positive for headaches. Negative for dizziness and loss of consciousness.  Endo/Heme/Allergies: Negative for environmental allergies.  Psychiatric/Behavioral: Negative for depression, hallucinations, substance abuse and suicidal ideas. The patient is not nervous/anxious and does not have insomnia.    BP 122/80   Pulse 74   Temp 98.3 F (36.8 C) (Oral)   Resp 14   Ht  (1.626 m)   Wt 216 lb (98 kg)   SpO2 99%   BMI 37.08 kg/m   Physical Exam  Constitutional: She is oriented to person, place, and time.  HENT:  Head: Normocephalic and atraumatic.  Right Ear: Tympanic membrane, external ear and ear canal normal.  Left Ear: Tympanic membrane, external ear and ear canal normal.  Nose: Nose normal. No mucosal edema.  Mouth/Throat: Uvula is midline, oropharynx is clear and moist and mucous membranes are normal. No oropharyngeal exudate or posterior oropharyngeal erythema.  Eyes: Pupils are equal, round, and reactive to light. Conjunctivae are normal.  Neck: Neck supple. No thyromegaly present.  Cardiovascular: Normal rate, regular rhythm, normal heart sounds and intact distal pulses.  Pulmonary/Chest: Effort normal and breath sounds normal. No respiratory distress. She has no wheezes. She has no rales.  Abdominal: Soft. Bowel sounds are normal. She exhibits no distension and no mass. There is no tenderness. There is no rebound and no guarding.  Lymphadenopathy:    She has no cervical adenopathy.  Neurological: She is alert and oriented to person, place, and time. No cranial nerve deficit.  Skin: Skin is warm.  Mild redness of nose and buccal regions noted with some mild  telangectasia noted. No acneiform lesions spotted on exam. Seems consistent with a mild rosacea.   Vitals reviewed.  Assessment/Plan: Migraine Continue symptoms journal to assess for potential triggers. Follow-up with GYN regarding Mirena. Rx imitrex given.   Rosacea Referral to Dermatology placed.   Visit for preventive health examination Depression screen negative. Health Maintenance reviewed. Preventive schedule discussed and handout given in AVS. Will obtain fasting labs today.     [name], PA-C

## 2018-01-18 DIAGNOSIS — Z30018 Encounter for initial prescription of other contraceptives: Secondary | ICD-10-CM | POA: Diagnosis not present

## 2018-01-18 DIAGNOSIS — Z30432 Encounter for removal of intrauterine contraceptive device: Secondary | ICD-10-CM | POA: Diagnosis not present

## 2018-02-15 ENCOUNTER — Ambulatory Visit: Payer: Self-pay

## 2018-02-15 NOTE — Telephone Encounter (Signed)
Rec'd call from pt. with c/o stomach upset for past 2 weeks.  Reported she doesn't have abdominal pain, but her "stomach aches and feels like it is in knots."  Has not been able to eat normally; described a "full feeling."  Stated she feels like she could vomit intermittently, but has tried to avoid this; has vomited x 2 over past 2 weeks. Reported vomited "stomach acid and bile" last night, and noticed about one tsp. of blood in it.  Stated she takes Protonix for acid reflux, but doesn't take it everyday, due to side effect of diarrhea.  Stated she has been under increased stress at work.  Voiced concern about poss. stomach ulcer.  Tried to call her GI specialist, Dr. Judithann SheenSparks, and rec'd voice message that the office is closed for the holiday.   Appt. sched. with PCP tomorrow at 8:30 AM.  Care advice given per protocol.  Verb. Understanding.  Agrees with plan.            Reason for Disposition . Nausea lasts > 1 week  Answer Assessment - Initial Assessment Questions 1. NAUSEA SEVERITY: "How bad is the nausea?" (e.g., mild, moderate, severe; dehydration, weight loss)   - MILD: loss of appetite without change in eating habits   - MODERATE: decreased oral intake without significant weight loss, dehydration, or malnutrition   - SEVERE: inadequate caloric or fluid intake, significant weight loss, symptoms of dehydration     moderate 2. ONSET: "When did the nausea begin?"     X 2 weeks 3. VOMITING: "Any vomiting?" If so, ask: "How many times today?"     Has spells 2-3 times/ week feels like vomiting ; vomits about once per week. Last night vomited acid/ bile with about 1 tsp. of blood   4. RECURRENT SYMPTOM: "Have you had nausea before?" If so, ask: "When was the last time?" "What happened that time?"     See above 5. CAUSE: "What do you think is causing the nausea?"     Increased stress; questions if this is an ulcer ; on Protonix but doesn't take on a daily basis due to side effect of protonix 6.  PREGNANCY: "Is there any chance you are pregnant?" (e.g., unprotected intercourse, missed birth control pill, broken condom)     LMP; just had an IUD removed; is due to start menstrual cycle this week  Protocols used: NAUSEA-A-AH

## 2018-02-16 ENCOUNTER — Encounter: Payer: Self-pay | Admitting: Physician Assistant

## 2018-02-16 ENCOUNTER — Other Ambulatory Visit: Payer: Self-pay

## 2018-02-16 ENCOUNTER — Ambulatory Visit: Payer: BLUE CROSS/BLUE SHIELD | Admitting: Physician Assistant

## 2018-02-16 VITALS — BP 140/98 | HR 76 | Temp 98.3°F | Resp 14 | Ht 64.0 in | Wt 218.0 lb

## 2018-02-16 DIAGNOSIS — K2990 Gastroduodenitis, unspecified, without bleeding: Secondary | ICD-10-CM

## 2018-02-16 LAB — COMPREHENSIVE METABOLIC PANEL
ALT: 11 U/L (ref 0–35)
AST: 14 U/L (ref 0–37)
Albumin: 3.9 g/dL (ref 3.5–5.2)
Alkaline Phosphatase: 96 U/L (ref 39–117)
BUN: 12 mg/dL (ref 6–23)
CHLORIDE: 103 meq/L (ref 96–112)
CO2: 26 mEq/L (ref 19–32)
Calcium: 9.4 mg/dL (ref 8.4–10.5)
Creatinine, Ser: 0.64 mg/dL (ref 0.40–1.20)
GFR: 108.71 mL/min (ref >60.00)
GLUCOSE: 86 mg/dL (ref 70–99)
POTASSIUM: 4.3 meq/L (ref 3.5–5.1)
SODIUM: 137 meq/L (ref 135–145)
Total Bilirubin: 0.4 mg/dL (ref 0.2–1.2)
Total Protein: 7.2 g/dL (ref 6.0–8.3)

## 2018-02-16 LAB — CBC WITH DIFFERENTIAL/PLATELET
BASOS PCT: 0.6 % (ref 0.0–3.0)
Basophils Absolute: 0 10*3/uL (ref 0.0–0.1)
EOS PCT: 2.5 % (ref 0.0–5.0)
Eosinophils Absolute: 0.2 10*3/uL (ref 0.0–0.7)
HCT: 38.3 % (ref 36.0–46.0)
Hemoglobin: 13 g/dL (ref 12.0–15.0)
LYMPHS ABS: 1.4 10*3/uL (ref 0.7–4.0)
Lymphocytes Relative: 21.2 % (ref 12.0–46.0)
MCHC: 33.8 g/dL (ref 30.0–36.0)
MCV: 88.7 fl (ref 78.0–100.0)
MONO ABS: 0.5 10*3/uL (ref 0.1–1.0)
Monocytes Relative: 7.9 % (ref 3.0–12.0)
NEUTROS PCT: 67.8 % (ref 43.0–77.0)
Neutro Abs: 4.6 10*3/uL (ref 1.4–7.7)
PLATELETS: 350 10*3/uL (ref 150.0–400.0)
RBC: 4.32 Mil/uL (ref 3.87–5.11)
RDW: 13.6 % (ref 11.5–15.5)
WBC: 6.7 10*3/uL (ref 4.0–10.5)

## 2018-02-16 LAB — H. PYLORI ANTIBODY, IGG: H PYLORI IGG: NEGATIVE

## 2018-02-16 LAB — LIPASE: Lipase: 15 U/L (ref 11.0–59.0)

## 2018-02-16 MED ORDER — OMEPRAZOLE 40 MG PO CPDR: 40.0000 mg | DELAYED_RELEASE_CAPSULE | Freq: Every day | ORAL | 3 refills | Status: DC

## 2018-02-16 MED ORDER — SUCRALFATE 1 G PO TABS: 1.0000 g | ORAL_TABLET | Freq: Three times a day (TID) | ORAL | 0 refills | Status: DC

## 2018-02-16 NOTE — Progress Notes (Signed)
Patient presents to clinic today c/o 3 weeks of anorexia, nausea and a couple of episodes of emesis. Denies fever, chills. Notes some fatigue. Denies melena, hematochezia, tenesmus. Notes some occasional epigastric pain. Is still having GERD symptoms despite medication. States she cannot tolerate more that an couple of days in a row of Pantoprazole as it is causing diarrhea. .Increased stressors with work which she knows is affecting her. Is concerned cause her father had a history of esophageal carcinoma.   Past Medical History:  Diagnosis Date  . GERD (gastroesophageal reflux disease)   . History of chickenpox     Current Outpatient Medications on File Prior to Visit  Medication Sig Dispense Refill  . norelgestromin-ethinyl estradiol Marilu Favre) 150-35 MCG/24HR transdermal patch Xulane 150 mcg-35 mcg/24 hr transdermal patch  Apply 1 patch every week by transdermal route for 21 days.    . pantoprazole (PROTONIX) 40 MG tablet Take 1 tablet (40 mg total) by mouth daily. (Patient not taking: Reported on 02/16/2018) 30 tablet 0  . SUMAtriptan (IMITREX) 25 MG tablet Take 1 tablet (25 mg total) by mouth once for 1 dose. May repeat in 2 hours if headache persists or recurs. (Patient not taking: Reported on 02/16/2018) 10 tablet 0   No current facility-administered medications on file prior to visit.     Allergies  Allergen Reactions  . Penicillins Swelling  . Sulfa Antibiotics Itching    Family History  Problem Relation Age of Onset  . Healthy Mother   . Hypertension Father   . Cancer Father        Esophageal  . Hypertension Brother   . Diabetes Brother   . Diabetes Maternal Grandmother   . Heart attack Maternal Grandfather 55  . Diabetes Paternal Grandmother   . Heart attack Paternal Grandfather 26  . Healthy Daughter     Social History   Socioeconomic History  . Marital status: Legally Separated    Spouse name: Micheal  . Number of children: 1  . Years of education: 77  .  Highest education level: Not on file  Occupational History  . Occupation: HR Specialist  Social Needs  . Financial resource strain: Not on file  . Food insecurity:    Worry: Not on file    Inability: Not on file  . Transportation needs:    Medical: Not on file    Non-medical: Not on file  Tobacco Use  . Smoking status: Former Research scientist (life sciences)  . Smokeless tobacco: Never Used  Substance and Sexual Activity  . Alcohol use: No  . Drug use: No  . Sexual activity: Yes    Birth control/protection: IUD    Comment: Mirena  Lifestyle  . Physical activity:    Days per week: Not on file    Minutes per session: Not on file  . Stress: Not on file  Relationships  . Social connections:    Talks on phone: Not on file    Gets together: Not on file    Attends religious service: Not on file    Active member of club or organization: Not on file    Attends meetings of clubs or organizations: Not on file    Relationship status: Not on file  Other Topics Concern  . Not on file  Social History Narrative  . Not on file   Review of Systems - See HPI.  All other ROS are negative.  BP (!) 140/98   Pulse 76   Temp 98.3 F (36.8 C) (Oral)  Resp 14   Ht 5' 4"  (1.626 m)   Wt 218 lb (98.9 kg)   SpO2 99%   BMI 37.42 kg/m   Physical Exam  Constitutional: She appears well-developed and well-nourished.  HENT:  Head: Normocephalic and atraumatic.  Eyes: Conjunctivae are normal.  Neck: Neck supple.  Cardiovascular: Normal rate, regular rhythm, normal heart sounds and intact distal pulses.  Pulmonary/Chest: Effort normal and breath sounds normal.  Abdominal: Soft. Bowel sounds are normal. She exhibits no distension and no mass. There is tenderness in the epigastric area and left upper quadrant. There is no rebound and no guarding. No hernia.  Vitals reviewed.  Assessment/Plan: 1. Gastritis and duodenitis Will check labs today as noted below, including H. Pylori testing. Stop Protonix. Start Omeprazole  and Carafate. GERD diet reviewed. Close follow-up scheduled. Will need GI assessment if not improving with this regimen.   - Lipase - CBC w/Diff - Comp Met (CMET) - H. pylori antibody, IgG - omeprazole (PRILOSEC) 40 MG capsule; Take 1 capsule (40 mg total) by mouth daily.  Dispense: 30 capsule; Refill: 3 - sucralfate (CARAFATE) 1 g tablet; Take 1 tablet (1 g total) by mouth 4 (four) times daily -  with meals and at bedtime.  Dispense: 30 tablet; Refill: 0   Leeanne Rio, PA-C

## 2018-02-16 NOTE — Patient Instructions (Signed)
Please go to the lab today for blood work.  I will call you with your results. We will alter treatment regimen(s) if indicated by your results.   Stop the Pantoprazole. Start the Omeprazole and Carafate as directed. Follow the diet below. Follow-up with me in 10-14 days. If not markedly improved, will need GI assessment.    Food Choices for Gastroesophageal Reflux Disease, Adult When you have gastroesophageal reflux disease (GERD), the foods you eat and your eating habits are very important. Choosing the right foods can help ease your discomfort. What guidelines do I need to follow?  Choose fruits, vegetables, whole grains, and low-fat dairy products.  Choose low-fat meat, fish, and poultry.  Limit fats such as oils, salad dressings, butter, nuts, and avocado.  Keep a food diary. This helps you identify foods that cause symptoms.  Avoid foods that cause symptoms. These may be different for everyone.  Eat small meals often instead of 3 large meals a day.  Eat your meals slowly, in a place where you are relaxed.  Limit fried foods.  Cook foods using methods other than frying.  Avoid drinking alcohol.  Avoid drinking large amounts of liquids with your meals.  Avoid bending over or lying down until 2-3 hours after eating. What foods are not recommended? These are some foods and drinks that may make your symptoms worse: Vegetables Tomatoes. Tomato juice. Tomato and spaghetti sauce. Chili peppers. Onion and garlic. Horseradish. Fruits Oranges, grapefruit, and lemon (fruit and juice). Meats High-fat meats, fish, and poultry. This includes hot dogs, ribs, ham, sausage, salami, and bacon. Dairy Whole milk and chocolate milk. Sour cream. Cream. Butter. Ice cream. Cream cheese. Drinks Coffee and tea. Bubbly (carbonated) drinks or energy drinks. Condiments Hot sauce. Barbecue sauce. Sweets/Desserts Chocolate and cocoa. Donuts. Peppermint and spearmint. Fats and  Oils High-fat foods. This includes [place] fries and potato chips. Other Vinegar. Strong spices. This includes black pepper, white pepper, red pepper, cayenne, curry powder, cloves, ginger, and chili powder. The items listed above may not be a complete list of foods and drinks to avoid. Contact your dietitian for more information. This information is not intended to replace advice given to you by your health care provider. Make sure you discuss any questions you have with your health care provider. Document Released: 09/13/2011 Document Revised: 08/20/2015 Document Reviewed: 01/16/2013 Elsevier Interactive Patient Education  2017 [employer]

## 2018-04-20 DIAGNOSIS — Z3040 Encounter for surveillance of contraceptives, unspecified: Secondary | ICD-10-CM | POA: Diagnosis not present

## 2018-04-20 DIAGNOSIS — Z793 Long term (current) use of hormonal contraceptives: Secondary | ICD-10-CM | POA: Diagnosis not present

## 2018-05-17 DIAGNOSIS — Z1231 Encounter for screening mammogram for malignant neoplasm of breast: Secondary | ICD-10-CM | POA: Diagnosis not present

## 2018-05-17 DIAGNOSIS — Z124 Encounter for screening for malignant neoplasm of cervix: Secondary | ICD-10-CM | POA: Diagnosis not present

## 2018-05-17 DIAGNOSIS — Z01419 Encounter for gynecological examination (general) (routine) without abnormal findings: Secondary | ICD-10-CM | POA: Diagnosis not present

## 2018-05-17 DIAGNOSIS — Z793 Long term (current) use of hormonal contraceptives: Secondary | ICD-10-CM | POA: Diagnosis not present

## 2018-05-17 DIAGNOSIS — Z130 Encounter for screening for diseases of the blood and blood-forming organs and certain disorders involving the immune mechanism: Secondary | ICD-10-CM | POA: Diagnosis not present

## 2018-05-17 DIAGNOSIS — N898 Other specified noninflammatory disorders of vagina: Secondary | ICD-10-CM | POA: Diagnosis not present

## 2018-05-17 LAB — HM PAP SMEAR: HM Pap smear: NEGATIVE

## 2018-05-17 LAB — HM MAMMOGRAPHY: HM Mammogram: NORMAL (ref 0–4)

## 2018-05-18 ENCOUNTER — Encounter: Payer: Self-pay | Admitting: Emergency Medicine

## 2018-05-18 DIAGNOSIS — Z124 Encounter for screening for malignant neoplasm of cervix: Secondary | ICD-10-CM | POA: Diagnosis not present

## 2018-05-18 DIAGNOSIS — R87610 Atypical squamous cells of undetermined significance on cytologic smear of cervix (ASC-US): Secondary | ICD-10-CM | POA: Diagnosis not present

## 2018-05-21 ENCOUNTER — Ambulatory Visit: Payer: BLUE CROSS/BLUE SHIELD | Admitting: Physician Assistant

## 2018-05-21 ENCOUNTER — Encounter: Payer: Self-pay | Admitting: Physician Assistant

## 2018-05-21 ENCOUNTER — Other Ambulatory Visit: Payer: Self-pay

## 2018-05-21 VITALS — BP 122/90 | HR 83 | Temp 98.9°F | Resp 16 | Ht 64.0 in | Wt 217.0 lb

## 2018-05-21 DIAGNOSIS — I10 Essential (primary) hypertension: Secondary | ICD-10-CM | POA: Diagnosis not present

## 2018-05-21 NOTE — Patient Instructions (Signed)
Please go to the lab today for blood work.  I will call you with your results. We will alter treatment regimen(s) if indicated by your results.   Your EKG looks good.  I want you to follow the dietary recommendations below.  Stay off of the hormone patch. Check BP daily and record over the next week.  If not coming down, and labs are normal, we will need to start a medication.    DASH Eating Plan DASH stands for "Dietary Approaches to Stop Hypertension." The DASH eating plan is a healthy eating plan that has been shown to reduce high blood pressure (hypertension). It may also reduce your risk for type 2 diabetes, heart disease, and stroke. The DASH eating plan may also help with weight loss. What are tips for following this plan?  General guidelines  Avoid eating more than 2,300 mg (milligrams) of salt (sodium) a day. If you have hypertension, you may need to reduce your sodium intake to 1,500 mg a day.  Limit alcohol intake to no more than 1 drink a day for nonpregnant women and 2 drinks a day for men. One drink equals 12 oz of beer, 5 oz of wine, or 1 oz of hard liquor.  Work with your health care provider to maintain a healthy body weight or to lose weight. Ask what an ideal weight is for you.  Get at least 30 minutes of exercise that causes your heart to beat faster (aerobic exercise) most days of the week. Activities may include walking, swimming, or biking.  Work with your health care provider or diet and nutrition specialist (dietitian) to adjust your eating plan to your individual calorie needs. Reading food labels   Check food labels for the amount of sodium per serving. Choose foods with less than 5 percent of the Daily Value of sodium. Generally, foods with less than 300 mg of sodium per serving fit into this eating plan.  To find whole grains, look for the word "whole" as the first word in the ingredient list. Shopping  Buy products labeled as "low-sodium" or "no salt  added."  Buy fresh foods. Avoid canned foods and premade or frozen meals. Cooking  Avoid adding salt when cooking. Use salt-free seasonings or herbs instead of table salt or sea salt. Check with your health care provider or pharmacist before using salt substitutes.  Do not fry foods. Cook foods using healthy methods such as baking, boiling, grilling, and broiling instead.  Cook with heart-healthy oils, such as olive, canola, soybean, or sunflower oil. Meal planning  Eat a balanced diet that includes: ? 5 or more servings of fruits and vegetables each day. At each meal, try to fill half of your plate with fruits and vegetables. ? Up to 6-8 servings of whole grains each day. ? Less than 6 oz of lean meat, poultry, or fish each day. A 3-oz serving of meat is about the same size as a deck of cards. One egg equals 1 oz. ? 2 servings of low-fat dairy each day. ? A serving of nuts, seeds, or beans 5 times each week. ? Heart-healthy fats. Healthy fats called Omega-3 fatty acids are found in foods such as flaxseeds and coldwater fish, like sardines, salmon, and mackerel.  Limit how much you eat of the following: ? Canned or prepackaged foods. ? Food that is high in trans fat, such as fried foods. ? Food that is high in saturated fat, such as fatty meat. ? Sweets, desserts, sugary drinks,  and other foods with added sugar. ? Full-fat dairy products.  Do not salt foods before eating.  Try to eat at least 2 vegetarian meals each week.  Eat more home-cooked food and less restaurant, buffet, and fast food.  When eating at a restaurant, ask that your food be prepared with less salt or no salt, if possible. What foods are recommended? The items listed may not be a complete list. Talk with your dietitian about what dietary choices are best for you. Grains Whole-grain or whole-wheat bread. Whole-grain or whole-wheat pasta. Brown rice. Modena Morrow. Bulgur. Whole-grain and low-sodium cereals.  Pita bread. Low-fat, low-sodium crackers. Whole-wheat flour tortillas. Vegetables Fresh or frozen vegetables (raw, steamed, roasted, or grilled). Low-sodium or reduced-sodium tomato and vegetable juice. Low-sodium or reduced-sodium tomato sauce and tomato paste. Low-sodium or reduced-sodium canned vegetables. Fruits All fresh, dried, or frozen fruit. Canned fruit in natural juice (without added sugar). Meat and other protein foods Skinless chicken or [place]. Ground chicken or [place]. Pork with fat trimmed off. Fish and seafood. Egg whites. Dried beans, peas, or lentils. Unsalted nuts, nut butters, and seeds. Unsalted canned beans. Lean cuts of beef with fat trimmed off. Low-sodium, lean deli meat. Dairy Low-fat (1%) or fat-free (skim) milk. Fat-free, low-fat, or reduced-fat cheeses. Nonfat, low-sodium ricotta or cottage cheese. Low-fat or nonfat yogurt. Low-fat, low-sodium cheese. Fats and oils Soft margarine without trans fats. Vegetable oil. Low-fat, reduced-fat, or light mayonnaise and salad dressings (reduced-sodium). Canola, safflower, olive, soybean, and sunflower oils. Avocado. Seasoning and other foods Herbs. Spices. Seasoning mixes without salt. Unsalted popcorn and pretzels. Fat-free sweets. What foods are not recommended? The items listed may not be a complete list. Talk with your dietitian about what dietary choices are best for you. Grains Baked goods made with fat, such as croissants, muffins, or some breads. Dry pasta or rice meal packs. Vegetables Creamed or fried vegetables. Vegetables in a cheese sauce. Regular canned vegetables (not low-sodium or reduced-sodium). Regular canned tomato sauce and paste (not low-sodium or reduced-sodium). Regular tomato and vegetable juice (not low-sodium or reduced-sodium). Angie Fava. Olives. Fruits Canned fruit in a light or heavy syrup. Fried fruit. Fruit in cream or butter sauce. Meat and other protein foods Fatty cuts of meat. Ribs. Fried  meat. Berniece Salines. Sausage. Bologna and other processed lunch meats. Salami. Fatback. Hotdogs. Bratwurst. Salted nuts and seeds. Canned beans with added salt. Canned or smoked fish. Whole eggs or egg yolks. Chicken or [place] with skin. Dairy Whole or 2% milk, cream, and half-and-half. Whole or full-fat cream cheese. Whole-fat or sweetened yogurt. Full-fat cheese. Nondairy creamers. Whipped toppings. Processed cheese and cheese spreads. Fats and oils Butter. Stick margarine. Lard. Shortening. Ghee. Bacon fat. Tropical oils, such as coconut, palm kernel, or palm oil. Seasoning and other foods Salted popcorn and pretzels. Onion salt, garlic salt, seasoned salt, table salt, and sea salt. Worcestershire sauce. Tartar sauce. Barbecue sauce. Teriyaki sauce. Soy sauce, including reduced-sodium. Steak sauce. Canned and packaged gravies. Fish sauce. Oyster sauce. Cocktail sauce. Horseradish that you find on the shelf. Ketchup. Mustard. Meat flavorings and tenderizers. Bouillon cubes. Hot sauce and Tabasco sauce. Premade or packaged marinades. Premade or packaged taco seasonings. Relishes. Regular salad dressings. Where to find more information:  National Heart, Lung, and [place]: https://wilson-eaton.com/  American Heart Association: www.heart.org Summary  The DASH eating plan is a healthy eating plan that has been shown to reduce high blood pressure (hypertension). It may also reduce your risk for type 2 diabetes, heart disease, and stroke.  With the DASH eating plan, you should limit salt (sodium) intake to 2,300 mg a day. If you have hypertension, you may need to reduce your sodium intake to 1,500 mg a day.  When on the DASH eating plan, aim to eat more fresh fruits and vegetables, whole grains, lean proteins, low-fat dairy, and heart-healthy fats.  Work with your health care provider or diet and nutrition specialist (dietitian) to adjust your eating plan to your individual calorie needs. This information is  not intended to replace advice given to you by your health care provider. Make sure you discuss any questions you have with your health care provider. Document Released: 03/03/2011 Document Revised: 03/07/2016 Document Reviewed: 03/07/2016 Elsevier Interactive Patient Education  2019 [employer].

## 2018-05-21 NOTE — Assessment & Plan Note (Signed)
Likely combination of stressors and hormone patch. She has stopped patch for now. Continue staying off of this. Stress relief tactics reviewed. DASH diet recommended. Handout given. EKG today with normal sinus rhythm. Will check lab panel today. Continue home BP checks. Follow-up scheduled.

## 2018-05-21 NOTE — Progress Notes (Signed)
Patient presents to clinic today to follow-up regarding BP. Patient without prior diagnosis of hypertension. Has had elevated BP on recent assessment with GYN. Was on a hormone patch which is being held until patient has further evaluation of elevated BP. Patient endorses she hass been checking BP at home over the past week. Notes BP at home averaging around 120-140/90s. Denies change in diet, hydration or activity level. Keeps a low-salt diet. Notes sleep is hit or miss. Notes significant increase in stressors. Patient endorses work has been very stressful recently. Notes a lot of added pressures regarding a project she is leading at work. No added stressors at home. Patient denies chest pain, palpitations, dizziness, vision changes or frequent headaches.Notes rare lightheadedness but has happened a couple of times. Can happen at anytime, usually while resting. Worse sometimes as needing. Denies reflux symptoms presently.   BP Readings from Last 3 Encounters:  05/21/18 122/90  02/16/18 (!) 140/98  07/27/17 122/80    Past Medical History:  Diagnosis Date  . GERD (gastroesophageal reflux disease)   . History of chickenpox     Current Outpatient Medications on File Prior to Visit  Medication Sig Dispense Refill  . metroNIDAZOLE (FLAGYL) 500 MG tablet Take 500 mg by mouth 2 (two) times daily.    . norelgestromin-ethinyl estradiol [facility]) 150-35 MCG/24HR transdermal patch Xulane 150 mcg-35 mcg/24 hr transdermal patch  Apply 1 patch every week by transdermal route for 21 days.    Marland Kitchen omeprazole (PRILOSEC) 40 MG capsule Take 1 capsule (40 mg total) by mouth daily. 30 capsule 3   No current facility-administered medications on file prior to visit.     Allergies  Allergen Reactions  . Penicillins Swelling  . Sulfa Antibiotics Itching    Family History  Problem Relation Age of Onset  . Healthy Mother   . Hypertension Father   . Cancer Father        Esophageal  . Hypertension Brother     . Diabetes Brother   . Diabetes Maternal Grandmother   . Heart attack Maternal Grandfather 55  . Diabetes Paternal Grandmother   . Heart attack Paternal Grandfather 31  . Healthy Daughter     Social History   Socioeconomic History  . Marital status: Legally Separated    Spouse name: Micheal  . Number of children: 1  . Years of education: 25  . Highest education level: Not on file  Occupational History  . Occupation: HR Specialist  Social Needs  . Financial resource strain: Not on file  . Food insecurity:    Worry: Not on file    Inability: Not on file  . Transportation needs:    Medical: Not on file    Non-medical: Not on file  Tobacco Use  . Smoking status: Former Games developer  . Smokeless tobacco: Never Used  Substance and Sexual Activity  . Alcohol use: No  . Drug use: No  . Sexual activity: Yes    Birth control/protection: I.U.D.    Comment: Mirena  Lifestyle  . Physical activity:    Days per week: Not on file    Minutes per session: Not on file  . Stress: Not on file  Relationships  . Social connections:    Talks on phone: Not on file    Gets together: Not on file    Attends religious service: Not on file    Active member of club or organization: Not on file    Attends meetings of clubs or organizations: Not  on file    Relationship status: Not on file  Other Topics Concern  . Not on file  Social History Narrative  . Not on file   Review of Systems - See HPI.  All other ROS are negative.  BP 122/90   Pulse 83   Temp 98.9 F (37.2 C) (Oral)   Resp 16   Ht 5\' 4"  (1.626 m)   Wt 217 lb (98.4 kg)   SpO2 99%   BMI 37.25 kg/m   Physical Exam Vitals signs reviewed.  Constitutional:      Appearance: Normal appearance.  HENT:     Head: Normocephalic and atraumatic.     Right Ear: Tympanic membrane normal.     Left Ear: Tympanic membrane normal.     Nose: Nose normal.     Mouth/Throat:     Mouth: Mucous membranes are moist.  Eyes:      Conjunctiva/sclera: Conjunctivae normal.     Pupils: Pupils are equal, round, and reactive to light.  Neck:     Musculoskeletal: Neck supple.  Cardiovascular:     Rate and Rhythm: Normal rate and regular rhythm.     Pulses: Normal pulses.     Heart sounds: Normal heart sounds.  Pulmonary:     Effort: Pulmonary effort is normal.     Breath sounds: Normal breath sounds.  Chest:     Chest wall: No tenderness.  Neurological:     General: No focal deficit present.     Mental Status: She is alert and oriented to person, place, and time.  Psychiatric:        Mood and Affect: Mood normal.    Recent Results (from the past 2160 hour(s))  HM MAMMOGRAPHY     Status: None   Collection Time: 05/17/18 12:00 AM  Result Value Ref Range   HM Mammogram Self Reported Normal 0-4 Bi-Rad, Self Reported Normal  HM PAP SMEAR     Status: None   Collection Time: 05/17/18 12:00 AM  Result Value Ref Range   HM Pap smear Negative    Assessment/Plan: Hypertension Likely combination of stressors and hormone patch. She has stopped patch for now. Continue staying off of this. Stress relief tactics reviewed. DASH diet recommended. Handout given. EKG today with normal sinus rhythm. Will check lab panel today. Continue home BP checks. Follow-up scheduled.     [name], PA-C

## 2018-05-22 LAB — BASIC METABOLIC PANEL
BUN: 8 mg/dL (ref 6–23)
CALCIUM: 9.2 mg/dL (ref 8.4–10.5)
CO2: 26 mEq/L (ref 19–32)
Chloride: 101 mEq/L (ref 96–112)
Creatinine, Ser: 0.62 mg/dL (ref 0.40–1.20)
GFR: 105.97 mL/min (ref >60.00)
Glucose, Bld: 93 mg/dL (ref 70–99)
Potassium: 4.1 mEq/L (ref 3.5–5.1)
SODIUM: 136 meq/L (ref 135–145)

## 2018-05-22 LAB — CBC WITH DIFFERENTIAL/PLATELET
Basophils Absolute: 0 10*3/uL (ref 0.0–0.1)
Basophils Relative: 0.5 % (ref 0.0–3.0)
Eosinophils Absolute: 0.2 10*3/uL (ref 0.0–0.7)
Eosinophils Relative: 1.9 % (ref 0.0–5.0)
HCT: 36.1 % (ref 36.0–46.0)
Hemoglobin: 12.2 g/dL (ref 12.0–15.0)
Lymphocytes Relative: 22.5 % (ref 12.0–46.0)
Lymphs Abs: 1.8 10*3/uL (ref 0.7–4.0)
MCHC: 33.8 g/dL (ref 30.0–36.0)
MCV: 86.8 fl (ref 78.0–100.0)
MONOS PCT: 8.9 % (ref 3.0–12.0)
Monocytes Absolute: 0.7 10*3/uL (ref 0.1–1.0)
Neutro Abs: 5.3 10*3/uL (ref 1.4–7.7)
Neutrophils Relative %: 66.2 % (ref 43.0–77.0)
Platelets: 386 10*3/uL (ref 150.0–400.0)
RBC: 4.16 Mil/uL (ref 3.87–5.11)
RDW: 14.8 % (ref 11.5–15.5)
WBC: 8.1 10*3/uL (ref 4.0–10.5)

## 2018-05-22 LAB — TSH: TSH: 1.06 u[IU]/mL (ref 0.35–4.50)

## 2018-05-29 ENCOUNTER — Telehealth: Payer: Self-pay

## 2018-05-29 NOTE — Telephone Encounter (Signed)
Copied from CRM [id number]. Topic: General - Other >> May 29, 2018 11:36 AM Lynne Logan D wrote: Reason for CRM: Pt stated she was supposed to call in her BP readings to [facility]. Please advise 05/23/18   136/86  05/24/18   144/87 05/26/18   139/86 05/27/18     130/83

## 2018-05-29 NOTE — Telephone Encounter (Signed)
Please review

## 2018-05-29 NOTE — Telephone Encounter (Signed)
So BP are still a little above goal but not terrible. How are we doing with a low salt diet thus far? What are we doing for exercise?

## 2018-05-30 NOTE — Telephone Encounter (Signed)
Will have her continue current regimen, start DASH diet and exercise. Recheck at follow-up in 304 weeks if not already scheduled.

## 2018-05-30 NOTE — Telephone Encounter (Signed)
Advised patient of blood pressures are still elevated but ok.  She has not started a low sodium diet She has not started exercise yet She is motivated to increase water, exercise and lifestyle changes.

## 2018-05-31 NOTE — Telephone Encounter (Signed)
Spoke with patient to give advise from PCP. Stated verbal understanding.  Appt was made for F/U on BP

## 2018-06-20 ENCOUNTER — Telehealth: Payer: Self-pay | Admitting: Emergency Medicine

## 2018-06-20 NOTE — Telephone Encounter (Signed)
Copied from CRM [id number]. Topic: Appointment Scheduling - Scheduling Inquiry for Clinic >> Jun 20, 2018 10:47 AM Baldo Daub L wrote: Reason for CRM:   Pt called and left voice message on [facility] mailbox on 06/19/2018 stating she was calling back about changing her OV on 06/29/2018 to a video visit.  Pt states she would like to do that and can be reached at [phone].

## 2018-06-21 NOTE — Telephone Encounter (Signed)
Scheduled patient for Virtual visit on 06/29/18. She is agreeable

## 2018-06-29 ENCOUNTER — Other Ambulatory Visit: Payer: Self-pay

## 2018-06-29 ENCOUNTER — Ambulatory Visit (INDEPENDENT_AMBULATORY_CARE_PROVIDER_SITE_OTHER): Payer: BLUE CROSS/BLUE SHIELD | Admitting: Physician Assistant

## 2018-06-29 ENCOUNTER — Encounter: Payer: Self-pay | Admitting: Physician Assistant

## 2018-06-29 ENCOUNTER — Ambulatory Visit: Payer: BLUE CROSS/BLUE SHIELD | Admitting: Physician Assistant

## 2018-06-29 VITALS — BP 122/87 | HR 77 | Temp 97.9°F | Resp 14 | Ht 64.0 in | Wt 209.0 lb

## 2018-06-29 DIAGNOSIS — I10 Essential (primary) hypertension: Secondary | ICD-10-CM

## 2018-06-29 NOTE — Progress Notes (Signed)
I have discussed the procedure for the virtual visit with the patient who has given consent to proceed with assessment and treatment.   Cleora Karnik S Jefte Carithers, CMA     

## 2018-06-29 NOTE — Progress Notes (Signed)
Virtual Visit via Video Note I connected with Joy Burns on 06/29/18 at  8:30 AM EDT by a video enabled telemedicine application and verified that I am speaking with the correct person using two identifiers.   I discussed the limitations of evaluation and management by telemedicine and the availability of in person appointments. The patient expressed understanding and agreed to proceed.  History of Present Illness: Patient present today via [employer] for hypertension follow-up. Patient with recent history of hypertension thought to be secondary to hormone patch. Has been off of the patch and doing well overall. Is checking BP at home ranging 120-130/80-85. Is walking every day -- 30 minutes to 1 hour. Has started the DASH diet overall. Watching portion sizes.  Has lost around 10 pounds.   Denies new concerns at today's visit.  Observations/Objective: Patient is well-developed, well-nourished in no acute distress. Resting comfortably in chair at home. Head is normocephalic, atraumatic. No labored breathing. Speech is clear and coherent with logical contest. She is alert and oriented at baseline.   Assessment and Plan: Hypertension -- Much improved. Controlled now with diet and exercise. No indication for medication. Continue DASH diet and exercise regimen. Follow-up 3 months.  Follow Up Instructions: Follow-up 3 months. Will set reminder to call patient in 2.5 months to schedule -- we will see where we are at with the coronavirus pandemic   I discussed the assessment and treatment plan with the patient. The patient was provided an opportunity to ask questions and all were answered. The patient agreed with the plan and demonstrated an understanding of the instructions.   The patient was advised to call back or seek an in-person evaluation if the symptoms worsen or if the condition fails to improve as anticipated.   [name], PA-C

## 2018-08-02 ENCOUNTER — Encounter: Payer: BLUE CROSS/BLUE SHIELD | Admitting: Physician Assistant

## 2018-10-10 ENCOUNTER — Encounter: Payer: BLUE CROSS/BLUE SHIELD | Admitting: Physician Assistant

## 2018-10-23 ENCOUNTER — Encounter: Payer: Self-pay | Admitting: Physician Assistant

## 2018-10-23 ENCOUNTER — Other Ambulatory Visit: Payer: Self-pay

## 2018-10-23 ENCOUNTER — Ambulatory Visit (INDEPENDENT_AMBULATORY_CARE_PROVIDER_SITE_OTHER): Payer: BC Managed Care – PPO | Admitting: Physician Assistant

## 2018-10-23 VITALS — BP 100/80 | HR 87 | Temp 98.3°F | Resp 16 | Ht 64.0 in | Wt 206.0 lb

## 2018-10-23 DIAGNOSIS — Z0000 Encounter for general adult medical examination without abnormal findings: Secondary | ICD-10-CM

## 2018-10-23 DIAGNOSIS — Z6835 Body mass index (BMI) 35.0-35.9, adult: Secondary | ICD-10-CM | POA: Diagnosis not present

## 2018-10-23 DIAGNOSIS — E6609 Other obesity due to excess calories: Secondary | ICD-10-CM

## 2018-10-23 DIAGNOSIS — K219 Gastro-esophageal reflux disease without esophagitis: Secondary | ICD-10-CM

## 2018-10-23 LAB — CBC WITH DIFFERENTIAL/PLATELET
Basophils Absolute: 0 10*3/uL (ref 0.0–0.1)
Basophils Relative: 0.6 % (ref 0.0–3.0)
Eosinophils Absolute: 0.1 10*3/uL (ref 0.0–0.7)
Eosinophils Relative: 1.6 % (ref 0.0–5.0)
HCT: 40.1 % (ref 36.0–46.0)
Hemoglobin: 13.3 g/dL (ref 12.0–15.0)
Lymphocytes Relative: 22.1 % (ref 12.0–46.0)
Lymphs Abs: 1.4 10*3/uL (ref 0.7–4.0)
MCHC: 33 g/dL (ref 30.0–36.0)
MCV: 90.3 fl (ref 78.0–100.0)
Monocytes Absolute: 0.5 10*3/uL (ref 0.1–1.0)
Monocytes Relative: 7.9 % (ref 3.0–12.0)
Neutro Abs: 4.4 10*3/uL (ref 1.4–7.7)
Neutrophils Relative %: 67.8 % (ref 43.0–77.0)
Platelets: 321 10*3/uL (ref 150.0–400.0)
RBC: 4.44 Mil/uL (ref 3.87–5.11)
RDW: 14.2 % (ref 11.5–15.5)
WBC: 6.5 10*3/uL (ref 4.0–10.5)

## 2018-10-23 LAB — COMPREHENSIVE METABOLIC PANEL
ALT: 15 U/L (ref 0–35)
AST: 16 U/L (ref 0–37)
Albumin: 4.1 g/dL (ref 3.5–5.2)
Alkaline Phosphatase: 113 U/L (ref 39–117)
BUN: 12 mg/dL (ref 6–23)
CO2: 28 mEq/L (ref 19–32)
Calcium: 9.6 mg/dL (ref 8.4–10.5)
Chloride: 102 mEq/L (ref 96–112)
Creatinine, Ser: 0.71 mg/dL (ref 0.40–1.20)
GFR: 90.43 mL/min (ref >60.00)
Glucose, Bld: 86 mg/dL (ref 70–99)
Potassium: 4.5 mEq/L (ref 3.5–5.1)
Sodium: 137 mEq/L (ref 135–145)
Total Bilirubin: 0.4 mg/dL (ref 0.2–1.2)
Total Protein: 7.2 g/dL (ref 6.0–8.3)

## 2018-10-23 LAB — LIPID PANEL
Cholesterol: 174 mg/dL (ref 0–200)
HDL: 56.5 mg/dL (ref >39.00)
LDL Cholesterol: 97 mg/dL (ref 0–99)
NonHDL: 117.78
Total CHOL/HDL Ratio: 3
Triglycerides: 103 mg/dL (ref 0.0–149.0)
VLDL: 20.6 mg/dL (ref 0.0–40.0)

## 2018-10-23 LAB — TSH: TSH: 1.01 u[IU]/mL (ref 0.35–4.50)

## 2018-10-23 LAB — HEMOGLOBIN A1C: Hgb A1c MFr Bld: 5.4 % (ref 4.6–6.5)

## 2018-10-23 NOTE — Progress Notes (Signed)
Patient presents to clinic today for annual exam.  Patient is fasting for labs.  Diet -- Endorses well-balanced diet overall. Exercise -- Walks a few times per week if able but no true regimen currently.  Denies acute concerns today.   Chronic Issues: GERD -- Currently on a regimen of Omeprazole 40 every other day. Notes good control of symptoms with this. If she goes longer between doses she does note recurrent symptoms. Denies breakthrough symptoms on the medicine. Denies melena, hematochezia or tenesmus.   Health Maintenance: Immunizations -- UTD Mammogram -- UTD PAP -- UTD  Past Medical History:  Diagnosis Date  . GERD (gastroesophageal reflux disease)   . History of chickenpox     Past Surgical History:  Procedure Laterality Date  . WISDOM TOOTH EXTRACTION      Current Outpatient Medications on File Prior to Visit  Medication Sig Dispense Refill  . omeprazole (PRILOSEC) 40 MG capsule Take 1 capsule (40 mg total) by mouth daily. 30 capsule 3   No current facility-administered medications on file prior to visit.     Allergies  Allergen Reactions  . Penicillins Swelling  . Sulfa Antibiotics Itching    Family History  Problem Relation Age of Onset  . Healthy Mother   . Hypertension Father   . Cancer Father        Esophageal  . Hypertension Brother   . Diabetes Brother   . Diabetes Maternal Grandmother   . Heart attack Maternal Grandfather 55  . Diabetes Paternal Grandmother   . Heart attack Paternal Grandfather [phone]  . Healthy Daughter     Social History   Socioeconomic History  . Marital status: Legally Separated    Spouse name: Micheal  . Number of children: 1  . Years of education: 4012  . Highest education level: Not on file  Occupational History  . Occupation: HR Specialist  Social Needs  . Financial resource strain: Not on file  . Food insecurity    Worry: Not on file    Inability: Not on file  . Transportation needs    Medical: Not on  file    Non-medical: Not on file  Tobacco Use  . Smoking status: Former Games developermoker  . Smokeless tobacco: Never Used  Substance and Sexual Activity  . Alcohol use: No  . Drug use: No  . Sexual activity: Yes    Birth control/protection: I.U.D.    Comment: Mirena  Lifestyle  . Physical activity    Days per week: Not on file    Minutes per session: Not on file  . Stress: Not on file  Relationships  . Social Musicianconnections    Talks on phone: Not on file    Gets together: Not on file    Attends religious service: Not on file    Active member of club or organization: Not on file    Attends meetings of clubs or organizations: Not on file    Relationship status: Not on file  . Intimate partner violence    Fear of current or ex partner: Not on file    Emotionally abused: Not on file    Physically abused: Not on file    Forced sexual activity: Not on file  Other Topics Concern  . Not on file  Social History Narrative  . Not on file   Review of Systems  Constitutional: Negative for fever and weight loss.  HENT: Negative for ear discharge, ear pain, hearing loss and tinnitus.   Eyes:  Negative for blurred vision, double vision, photophobia and pain.  Respiratory: Negative for cough and shortness of breath.   Cardiovascular: Negative for chest pain and palpitations.  Gastrointestinal: Negative for abdominal pain, blood in stool, constipation, diarrhea, heartburn, melena, nausea and vomiting.  Genitourinary: Negative for dysuria, flank pain, frequency, hematuria and urgency.  Musculoskeletal: Negative for falls.  Neurological: Negative for dizziness, loss of consciousness and headaches.  Endo/Heme/Allergies: Negative for environmental allergies.  Psychiatric/Behavioral: Negative for depression, hallucinations, substance abuse and suicidal ideas. The patient is not nervous/anxious and does not have insomnia.    BP 100/80   Pulse 87   Temp 98.3 F (36.8 C) (Skin)   Resp 16   Ht 5\' 4"   (1.626 m)   Wt 206 lb (93.4 kg)   SpO2 98%   BMI 35.36 kg/m   Physical Exam Vitals signs reviewed.  HENT:     Head: Normocephalic and atraumatic.     Right Ear: Tympanic membrane, ear canal and external ear normal.     Left Ear: Tympanic membrane, ear canal and external ear normal.     Nose: Nose normal. No mucosal edema.     Mouth/Throat:     Pharynx: Uvula midline. No oropharyngeal exudate or posterior oropharyngeal erythema.  Eyes:     Conjunctiva/sclera: Conjunctivae normal.     Pupils: Pupils are equal, round, and reactive to light.  Neck:     Musculoskeletal: Neck supple.     Thyroid: No thyromegaly.  Cardiovascular:     Rate and Rhythm: Normal rate and regular rhythm.     Heart sounds: Normal heart sounds.  Pulmonary:     Effort: Pulmonary effort is normal. No respiratory distress.     Breath sounds: Normal breath sounds. No wheezing or rales.  Abdominal:     General: Bowel sounds are normal. There is no distension.     Palpations: Abdomen is soft. There is no mass.     Tenderness: There is no abdominal tenderness. There is no guarding or rebound.  Lymphadenopathy:     Cervical: No cervical adenopathy.  Skin:    General: Skin is warm and dry.     Findings: No rash.  Neurological:     Mental Status: She is alert and oriented to person, place, and time.     Cranial Nerves: No cranial nerve deficit.    Assessment/Plan: 1. Visit for preventive health examination Depression screen negative. Health Maintenance reviewed -- UTD. Preventive schedule discussed and handout given in AVS. Will obtain fasting labs today.  - CBC with Differential/Platelet - Comprehensive metabolic panel - Hemoglobin A1c - Lipid panel - TSH  2. Gastroesophageal reflux disease without esophagitis Continue QOD dosing. Dietary and exercise recommendations again reviewed with patient. Will monitor. Meds refilled today.  3. Class 2 obesity due to excess calories without serious comorbidity  with body mass index (BMI) of 35.0 to 35.9 in adult Dietary and exercise recommendations reviewed. For now will work toward a 5% weight loss.     Leeanne Rio, PA-C

## 2018-10-23 NOTE — Patient Instructions (Signed)
Please go to the lab for blood work.   Our office will call you with your results unless you have chosen to receive results via MyChart.  If your blood work is normal we will follow-up each year for physicals and as scheduled for chronic medical problems.  If anything is abnormal we will treat accordingly and get you in for a follow-up.  I did place the referral to urology for your husband to discuss vasectomy..    Preventive Care 56-42 Years Old, Female Preventive care refers to visits with your health care provider and lifestyle choices that can promote health and wellness. This includes:  A yearly physical exam. This may also be called an annual well check.  Regular dental visits and eye exams.  Immunizations.  Screening for certain conditions.  Healthy lifestyle choices, such as eating a healthy diet, getting regular exercise, not using drugs or products that contain nicotine and tobacco, and limiting alcohol use. What can I expect for my preventive care visit? Physical exam Your health care provider will check your:  Height and weight. This may be used to calculate body mass index (BMI), which tells if you are at a healthy weight.  Heart rate and blood pressure.  Skin for abnormal spots. Counseling Your health care provider may ask you questions about your:  Alcohol, tobacco, and drug use.  Emotional well-being.  Home and relationship well-being.  Sexual activity.  Eating habits.  Work and work Statistician.  Method of birth control.  Menstrual cycle.  Pregnancy history. What immunizations do I need?  Influenza (flu) vaccine  This is recommended every year. Tetanus, diphtheria, and pertussis (Tdap) vaccine  You may need a Td booster every 10 years. Varicella (chickenpox) vaccine  You may need this if you have not been vaccinated. Zoster (shingles) vaccine  You may need this after age 39. Measles, mumps, and rubella (MMR) vaccine  You may need  at least one dose of MMR if you were born in 1957 or later. You may also need a second dose. Pneumococcal conjugate (PCV13) vaccine  You may need this if you have certain conditions and were not previously vaccinated. Pneumococcal polysaccharide (PPSV23) vaccine  You may need one or two doses if you smoke cigarettes or if you have certain conditions. Meningococcal conjugate (MenACWY) vaccine  You may need this if you have certain conditions. Hepatitis A vaccine  You may need this if you have certain conditions or if you travel or work in places where you may be exposed to hepatitis A. Hepatitis B vaccine  You may need this if you have certain conditions or if you travel or work in places where you may be exposed to hepatitis B. Haemophilus influenzae type b (Hib) vaccine  You may need this if you have certain conditions. Human papillomavirus (HPV) vaccine  If recommended by your health care provider, you may need three doses over 6 months. You may receive vaccines as individual doses or as more than one vaccine together in one shot (combination vaccines). Talk with your health care provider about the risks and benefits of combination vaccines. What tests do I need? Blood tests  Lipid and cholesterol levels. These may be checked every 5 years, or more frequently if you are over 66 years old.  Hepatitis C test.  Hepatitis B test. Screening  Lung cancer screening. You may have this screening every year starting at age 21 if you have a 30-pack-year history of smoking and currently smoke or have quit within  the past 15 years.  Colorectal cancer screening. All adults should have this screening starting at age 13 and continuing until age 42. Your health care provider may recommend screening at age 11 if you are at increased risk. You will have tests every 1-10 years, depending on your results and the type of screening test.  Diabetes screening. This is done by checking your blood sugar  (glucose) after you have not eaten for a while (fasting). You may have this done every 1-3 years.  Mammogram. This may be done every 1-2 years. Talk with your health care provider about when you should start having regular mammograms. This may depend on whether you have a family history of breast cancer.  BRCA-related cancer screening. This may be done if you have a family history of breast, ovarian, tubal, or peritoneal cancers.  Pelvic exam and Pap test. This may be done every 3 years starting at age 51. Starting at age 59, this may be done every 5 years if you have a Pap test in combination with an HPV test. Other tests  Sexually transmitted disease (STD) testing.  Bone density scan. This is done to screen for osteoporosis. You may have this scan if you are at high risk for osteoporosis. Follow these instructions at home: Eating and drinking  Eat a diet that includes fresh fruits and vegetables, whole grains, lean protein, and low-fat dairy.  Take vitamin and mineral supplements as recommended by your health care provider.  Do not drink alcohol if: ? Your health care provider tells you not to drink. ? You are pregnant, may be pregnant, or are planning to become pregnant.  If you drink alcohol: ? Limit how much you have to 0-1 drink a day. ? Be aware of how much alcohol is in your drink. In the [place] one drink equals one 12 oz bottle of beer (355 mL), one 5 oz glass of wine (148 mL), or one 1 oz glass of hard liquor (44 mL). Lifestyle  Take daily care of your teeth and gums.  Stay active. Exercise for at least 30 minutes on 5 or more days each week.  Do not use any products that contain nicotine or tobacco, such as cigarettes, e-cigarettes, and chewing tobacco. If you need help quitting, ask your health care provider.  If you are sexually active, practice safe sex. Use a condom or other form of birth control (contraception) in order to prevent pregnancy and STIs (sexually  transmitted infections).  If told by your health care provider, take low-dose aspirin daily starting at age 78. What's next?  Visit your health care provider once a year for a well check visit.  Ask your health care provider how often you should have your eyes and teeth checked.  Stay up to date on all vaccines. This information is not intended to replace advice given to you by your health care provider. Make sure you discuss any questions you have with your health care provider. Document Released: 04/10/2015 Document Revised: 11/23/2017 Document Reviewed: 11/23/2017 Elsevier Patient Education  2020 [employer].

## 2018-12-14 ENCOUNTER — Other Ambulatory Visit: Payer: Self-pay | Admitting: Physician Assistant

## 2018-12-14 DIAGNOSIS — K2990 Gastroduodenitis, unspecified, without bleeding: Secondary | ICD-10-CM

## 2019-05-28 ENCOUNTER — Ambulatory Visit (INDEPENDENT_AMBULATORY_CARE_PROVIDER_SITE_OTHER): Payer: 59 | Admitting: Physician Assistant

## 2019-05-28 ENCOUNTER — Encounter: Payer: Self-pay | Admitting: Physician Assistant

## 2019-05-28 ENCOUNTER — Other Ambulatory Visit: Payer: Self-pay

## 2019-05-28 VITALS — Temp 99.2°F

## 2019-05-28 DIAGNOSIS — J020 Streptococcal pharyngitis: Secondary | ICD-10-CM | POA: Diagnosis not present

## 2019-05-28 MED ORDER — AZITHROMYCIN 250 MG PO TABS: ORAL_TABLET | ORAL | 0 refills | Status: DC

## 2019-05-28 NOTE — Progress Notes (Signed)
I have discussed the procedure for the virtual visit with the patient who has given consent to proceed with assessment and treatment.   Disa Riedlinger S Kamaryn Grimley, CMA     

## 2019-05-28 NOTE — Progress Notes (Signed)
   Virtual Visit via Video   I connected with patient on 05/28/19 at 11:00 AM EST by a video enabled telemedicine application and verified that I am speaking with the correct person using two identifiers.  Location patient: Home Location provider: [facility] Persons participating in the virtual visit: Patient, Provider, CMA (Patina Moore)  I discussed the limitations of evaluation and management by telemedicine and the availability of in person appointments. The patient expressed understanding and agreed to proceed.  Subjective:   HPI:   Patient presents via doxy.need today complaining of 1.5 days of fever, chills, bilateral sore throat after exposure to strep throat (husband currently being treated).  Patient does note some right-sided nasal congestion and sinus pressure.  Denies Loss of taste or smell.  Denies chest congestion or cough.  Denies GI symptoms.  Denies other sick contact.  Works from home so no known Covid exposure.  Has been taking Tylenol and emergency for symptoms.  T-max at 100.1.  ROS:   See pertinent positives and negatives per HPI.  Patient Active Problem List   Diagnosis Date Noted  . Migraine 07/27/2017  . Rosacea 07/27/2017  . Gastroesophageal reflux disease without esophagitis 05/02/2016  . Visit for preventive health examination 05/02/2016    Social History   Tobacco Use  . Smoking status: Former Games developer  . Smokeless tobacco: Never Used  Substance Use Topics  . Alcohol use: No    Current Outpatient Medications:  .  omeprazole (PRILOSEC) 40 MG capsule, TAKE 1 CAPSULE BY MOUTH EVERY DAY, Disp: 90 capsule, Rfl: 1  Allergies  Allergen Reactions  . Penicillins Swelling  . Sulfa Antibiotics Itching    Objective:   There were no vitals taken for this visit.  Patient is well-developed, well-nourished in no acute distress.  Resting comfortably at home.  Head is normocephalic, atraumatic.  No labored breathing.  Speech is clear  and coherent with logical content.  Patient is alert and oriented at baseline.   Assessment and Plan:      1. Strep throat Likely culprit of symptoms.  Did discuss because she has some nasal congestion that there is still the potential this could be viral.  Supportive measures and OTC medications reviewed.  Giving exposure to strep from her husband and current symptoms, will start antibiotic therapy.  Patient is penicillin allergic (swelling) so we will start her on azithromycin.  Follow-up discussed. - azithromycin (ZITHROMAX) 250 MG tablet; Take 2 tablets on Day 1. Then take 1 tablet daily.  Dispense: 6 tablet; Refill: 0 .   [name], [place] 05/28/2019

## 2019-06-20 ENCOUNTER — Other Ambulatory Visit: Payer: Self-pay | Admitting: Physician Assistant

## 2019-06-20 DIAGNOSIS — K2990 Gastroduodenitis, unspecified, without bleeding: Secondary | ICD-10-CM

## 2019-10-24 ENCOUNTER — Other Ambulatory Visit: Payer: Self-pay

## 2019-10-24 ENCOUNTER — Ambulatory Visit (INDEPENDENT_AMBULATORY_CARE_PROVIDER_SITE_OTHER): Payer: No Typology Code available for payment source | Admitting: Physician Assistant

## 2019-10-24 ENCOUNTER — Encounter: Payer: Self-pay | Admitting: Physician Assistant

## 2019-10-24 VITALS — BP 112/86 | HR 82 | Temp 98.9°F | Resp 16 | Ht 64.0 in | Wt 214.0 lb

## 2019-10-24 DIAGNOSIS — Z8249 Family history of ischemic heart disease and other diseases of the circulatory system: Secondary | ICD-10-CM

## 2019-10-24 DIAGNOSIS — Z0000 Encounter for general adult medical examination without abnormal findings: Secondary | ICD-10-CM

## 2019-10-24 DIAGNOSIS — E669 Obesity, unspecified: Secondary | ICD-10-CM | POA: Diagnosis not present

## 2019-10-24 DIAGNOSIS — K219 Gastro-esophageal reflux disease without esophagitis: Secondary | ICD-10-CM

## 2019-10-24 LAB — LIPID PANEL
Cholesterol: 169 mg/dL (ref 0–200)
HDL: 59.3 mg/dL (ref >39.00)
LDL Cholesterol: 85 mg/dL (ref 0–99)
NonHDL: 109.47
Total CHOL/HDL Ratio: 3
Triglycerides: 121 mg/dL (ref 0.0–149.0)
VLDL: 24.2 mg/dL (ref 0.0–40.0)

## 2019-10-24 LAB — CBC WITH DIFFERENTIAL/PLATELET
Basophils Absolute: 0.1 10*3/uL (ref 0.0–0.1)
Basophils Relative: 1.5 % (ref 0.0–3.0)
Eosinophils Absolute: 0.1 10*3/uL (ref 0.0–0.7)
Eosinophils Relative: 1.5 % (ref 0.0–5.0)
HCT: 37.9 % (ref 36.0–46.0)
Hemoglobin: 13 g/dL (ref 12.0–15.0)
Lymphocytes Relative: 16.6 % (ref 12.0–46.0)
Lymphs Abs: 1.3 10*3/uL (ref 0.7–4.0)
MCHC: 34.3 g/dL (ref 30.0–36.0)
MCV: 89.9 fl (ref 78.0–100.0)
Monocytes Absolute: 0.5 10*3/uL (ref 0.1–1.0)
Monocytes Relative: 5.9 % (ref 3.0–12.0)
Neutro Abs: 5.9 10*3/uL (ref 1.4–7.7)
Neutrophils Relative %: 74.5 % (ref 43.0–77.0)
Platelets: 307 10*3/uL (ref 150.0–400.0)
RBC: 4.22 Mil/uL (ref 3.87–5.11)
RDW: 13.2 % (ref 11.5–15.5)
WBC: 7.9 10*3/uL (ref 4.0–10.5)

## 2019-10-24 LAB — COMPREHENSIVE METABOLIC PANEL
ALT: 13 U/L (ref 0–35)
AST: 14 U/L (ref 0–37)
Albumin: 3.9 g/dL (ref 3.5–5.2)
Alkaline Phosphatase: 104 U/L (ref 39–117)
BUN: 8 mg/dL (ref 6–23)
CO2: 28 mEq/L (ref 19–32)
Calcium: 9.2 mg/dL (ref 8.4–10.5)
Chloride: 102 mEq/L (ref 96–112)
Creatinine, Ser: 0.6 mg/dL (ref 0.40–1.20)
GFR: 109.3 mL/min (ref >60.00)
Glucose, Bld: 88 mg/dL (ref 70–99)
Potassium: 4.3 mEq/L (ref 3.5–5.1)
Sodium: 135 mEq/L (ref 135–145)
Total Bilirubin: 0.5 mg/dL (ref 0.2–1.2)
Total Protein: 7 g/dL (ref 6.0–8.3)

## 2019-10-24 LAB — HEMOGLOBIN A1C: Hgb A1c MFr Bld: 5.2 % (ref 4.6–6.5)

## 2019-10-24 LAB — VITAMIN D 25 HYDROXY (VIT D DEFICIENCY, FRACTURES): VITD: 10.35 ng/mL — ABNORMAL LOW (ref 30.00–100.00)

## 2019-10-24 LAB — TSH: TSH: 1.31 u[IU]/mL (ref 0.35–4.50)

## 2019-10-24 NOTE — Progress Notes (Signed)
Patient presents to clinic today for annual exam.  Patient is fasting for labs.  Diet -- Has maintained a well-balanced diet. Is maintaining current weight -- no ups or downs. Has increased water intake and cut back on diet sodas.   Exercise -- No regular exercise regimen.  Chronic Issues: GERD -- Well-controlled with her PPI on PRN basis. Is watching diet and avoiding late-night eating. Notes tomatoes are a major trigger for her.   Health Maintenance: Immunizations -- Tetanus up-to-date. Declines Covid vaccination.  Mammogram -- Up-to-date. Obtained by GYN. Records requested.  PAP -- UTD.   Past Medical History:  Diagnosis Date  . GERD (gastroesophageal reflux disease)   . History of chickenpox     Past Surgical History:  Procedure Laterality Date  . WISDOM TOOTH EXTRACTION      Current Outpatient Medications on File Prior to Visit  Medication Sig Dispense Refill  . omeprazole (PRILOSEC) 40 MG capsule TAKE 1 CAPSULE BY MOUTH EVERY DAY 90 capsule 1   No current facility-administered medications on file prior to visit.    Allergies  Allergen Reactions  . Penicillins Swelling  . Sulfa Antibiotics Itching    Family History  Problem Relation Age of Onset  . Healthy Mother   . Hypertension Father   . Cancer Father        Esophageal  . Hypertension Brother   . Diabetes Brother   . Diabetes Maternal Grandmother   . Heart attack Maternal Grandfather 55  . Heart disease Maternal Grandfather   . Hypertrophic cardiomyopathy Maternal Grandfather   . Diabetes Paternal Grandmother   . Heart attack Paternal Grandfather 27  . Healthy Daughter     Social History   Socioeconomic History  . Marital status: Legally Separated    Spouse name: Micheal  . Number of children: 1  . Years of education: 73  . Highest education level: Not on file  Occupational History  . Occupation: HR Specialist  Tobacco Use  . Smoking status: Former Games developer  . Smokeless tobacco: Never  Used  Vaping Use  . Vaping Use: Never used  Substance and Sexual Activity  . Alcohol use: No  . Drug use: No  . Sexual activity: Yes    Birth control/protection: I.U.D.    Comment: Mirena  Other Topics Concern  . Not on file  Social History Narrative  . Not on file   Social Determinants of Health   Financial Resource Strain:   . Difficulty of Paying Living Expenses:   Food Insecurity:   . Worried About Programme researcher, broadcasting/film/video in the Last Year:   . Barista in the Last Year:   Transportation Needs:   . Freight forwarder (Medical):   Marland Kitchen Lack of Transportation (Non-Medical):   Physical Activity:   . Days of Exercise per Week:   . Minutes of Exercise per Session:   Stress:   . Feeling of Stress :   Social Connections:   . Frequency of Communication with Friends and Family:   . Frequency of Social Gatherings with Friends and Family:   . Attends Religious Services:   . Active Member of Clubs or Organizations:   . Attends Banker Meetings:   Marland Kitchen Marital Status:   Intimate Partner Violence:   . Fear of Current or Ex-Partner:   . Emotionally Abused:   Marland Kitchen Physically Abused:   . Sexually Abused:    Review of Systems  Constitutional: Negative for fever and weight  loss.  HENT: Negative for ear discharge, ear pain, hearing loss and tinnitus.   Eyes: Negative for blurred vision, double vision, photophobia and pain.  Respiratory: Negative for cough and shortness of breath.   Cardiovascular: Negative for chest pain and palpitations.  Gastrointestinal: Negative for abdominal pain, blood in stool, constipation, diarrhea, heartburn, melena, nausea and vomiting.  Genitourinary: Negative for dysuria, flank pain, frequency, hematuria and urgency.  Musculoskeletal: Negative for falls.  Neurological: Negative for dizziness, loss of consciousness and headaches.  Endo/Heme/Allergies: Negative for environmental allergies.  Psychiatric/Behavioral: Negative for depression,  hallucinations, substance abuse and suicidal ideas. The patient is not nervous/anxious and does not have insomnia.    Wt (!) 214 lb (97.1 kg)   BMI 36.73 kg/m   Physical Exam Vitals reviewed.  HENT:     Head: Normocephalic and atraumatic.     Right Ear: Tympanic membrane, ear canal and external ear normal.     Left Ear: Tympanic membrane, ear canal and external ear normal.     Nose: Nose normal. No mucosal edema.     Mouth/Throat:     Pharynx: Uvula midline. No oropharyngeal exudate or posterior oropharyngeal erythema.  Eyes:     Conjunctiva/sclera: Conjunctivae normal.     Pupils: Pupils are equal, round, and reactive to light.  Neck:     Thyroid: No thyromegaly.  Cardiovascular:     Rate and Rhythm: Normal rate and regular rhythm.     Heart sounds: Normal heart sounds.  Pulmonary:     Effort: Pulmonary effort is normal. No respiratory distress.     Breath sounds: Normal breath sounds. No wheezing or rales.  Abdominal:     General: Bowel sounds are normal. There is no distension.     Palpations: Abdomen is soft. There is no mass.     Tenderness: There is no abdominal tenderness. There is no guarding or rebound.  Musculoskeletal:     Cervical back: Neck supple.  Lymphadenopathy:     Cervical: No cervical adenopathy.  Skin:    General: Skin is warm and dry.     Findings: No rash.  Neurological:     Mental Status: She is alert and oriented to person, place, and time.     Cranial Nerves: No cranial nerve deficit.    Assessment/Plan: 1. Visit for preventive health examination Depression screen negative. Health Maintenance reviewed. Preventive schedule discussed and handout given in AVS. Will obtain fasting labs today.  - CBC with Differential/Platelet - Comprehensive metabolic panel - Hemoglobin A1c - Lipid panel  2. Gastroesophageal reflux disease without esophagitis Doing well overall. Continue current regimen. Patient to continue working on diet and exercise for  weight loss as this is beneficial for reflux symptoms.  3. Obesity (BMI 35.0-39.9 without comorbidity) Dietary and exercise recommendations reviewed. Will check TSH level and vitamin D in addition to routine labs today. - TSH - Vitamin D (25 hydroxy)  4. Family history of hypertrophic cardiomyopathy Multiple family members. Patient asymptomatic. EKG on file from last year reveals normal sinus rhythm without evidence of left ventricular hypertrophy. Examination today unremarkable. We will proceed with echo to further assess just to be on the safe side. - ECHOCARDIOGRAM COMPLETE; Future  This visit occurred during the SARS-CoV-2 public health emergency.  Safety protocols were in place, including screening questions prior to the visit, additional usage of staff PPE, and extensive cleaning of exam room while observing appropriate contact time as indicated for disinfecting solutions.     [name], PA-C

## 2019-10-24 NOTE — Patient Instructions (Signed)
Please go to the lab for blood work.   Our office will call you with your results unless you have chosen to receive results via MyChart.  If your blood work is normal we will follow-up each year for physicals and as scheduled for chronic medical problems.  If anything is abnormal we will treat accordingly and get you in for a follow-up.  You will be contacted for your Echocardiogram. I will call you with results as soon as I have them. .   Preventive Care 43-82 Years Old, Female Preventive care refers to visits with your health care provider and lifestyle choices that can promote health and wellness. This includes:  A yearly physical exam. This may also be called an annual well check.  Regular dental visits and eye exams.  Immunizations.  Screening for certain conditions.  Healthy lifestyle choices, such as eating a healthy diet, getting regular exercise, not using drugs or products that contain nicotine and tobacco, and limiting alcohol use. What can I expect for my preventive care visit? Physical exam Your health care provider will check your:  Height and weight. This may be used to calculate body mass index (BMI), which tells if you are at a healthy weight.  Heart rate and blood pressure.  Skin for abnormal spots. Counseling Your health care provider may ask you questions about your:  Alcohol, tobacco, and drug use.  Emotional well-being.  Home and relationship well-being.  Sexual activity.  Eating habits.  Work and work Statistician.  Method of birth control.  Menstrual cycle.  Pregnancy history. What immunizations do I need?  Influenza (flu) vaccine  This is recommended every year. Tetanus, diphtheria, and pertussis (Tdap) vaccine  You may need a Td booster every 10 years. Varicella (chickenpox) vaccine  You may need this if you have not been vaccinated. Zoster (shingles) vaccine  You may need this after age 34. Measles, mumps, and rubella (MMR)  vaccine  You may need at least one dose of MMR if you were born in 1957 or later. You may also need a second dose. Pneumococcal conjugate (PCV13) vaccine  You may need this if you have certain conditions and were not previously vaccinated. Pneumococcal polysaccharide (PPSV23) vaccine  You may need one or two doses if you smoke cigarettes or if you have certain conditions. Meningococcal conjugate (MenACWY) vaccine  You may need this if you have certain conditions. Hepatitis A vaccine  You may need this if you have certain conditions or if you travel or work in places where you may be exposed to hepatitis A. Hepatitis B vaccine  You may need this if you have certain conditions or if you travel or work in places where you may be exposed to hepatitis B. Haemophilus influenzae type b (Hib) vaccine  You may need this if you have certain conditions. Human papillomavirus (HPV) vaccine  If recommended by your health care provider, you may need three doses over 6 months. You may receive vaccines as individual doses or as more than one vaccine together in one shot (combination vaccines). Talk with your health care provider about the risks and benefits of combination vaccines. What tests do I need? Blood tests  Lipid and cholesterol levels. These may be checked every 5 years, or more frequently if you are over 19 years old.  Hepatitis C test.  Hepatitis B test. Screening  Lung cancer screening. You may have this screening every year starting at age 61 if you have a 30-pack-year history of smoking and  currently smoke or have quit within the past 15 years.  Colorectal cancer screening. All adults should have this screening starting at age 31 and continuing until age 24. Your health care provider may recommend screening at age 54 if you are at increased risk. You will have tests every 1-10 years, depending on your results and the type of screening test.  Diabetes screening. This is done by  checking your blood sugar (glucose) after you have not eaten for a while (fasting). You may have this done every 1-3 years.  Mammogram. This may be done every 1-2 years. Talk with your health care provider about when you should start having regular mammograms. This may depend on whether you have a family history of breast cancer.  BRCA-related cancer screening. This may be done if you have a family history of breast, ovarian, tubal, or peritoneal cancers.  Pelvic exam and Pap test. This may be done every 3 years starting at age 60. Starting at age 63, this may be done every 5 years if you have a Pap test in combination with an HPV test. Other tests  Sexually transmitted disease (STD) testing.  Bone density scan. This is done to screen for osteoporosis. You may have this scan if you are at high risk for osteoporosis. Follow these instructions at home: Eating and drinking  Eat a diet that includes fresh fruits and vegetables, whole grains, lean protein, and low-fat dairy.  Take vitamin and mineral supplements as recommended by your health care provider.  Do not drink alcohol if: ? Your health care provider tells you not to drink. ? You are pregnant, may be pregnant, or are planning to become pregnant.  If you drink alcohol: ? Limit how much you have to 0-1 drink a day. ? Be aware of how much alcohol is in your drink. In the [place] one drink equals one 12 oz bottle of beer (355 mL), one 5 oz glass of wine (148 mL), or one 1 oz glass of hard liquor (44 mL). Lifestyle  Take daily care of your teeth and gums.  Stay active. Exercise for at least 30 minutes on 5 or more days each week.  Do not use any products that contain nicotine or tobacco, such as cigarettes, e-cigarettes, and chewing tobacco. If you need help quitting, ask your health care provider.  If you are sexually active, practice safe sex. Use a condom or other form of birth control (contraception) in order to prevent pregnancy  and STIs (sexually transmitted infections).  If told by your health care provider, take low-dose aspirin daily starting at age 35. What's next?  Visit your health care provider once a year for a well check visit.  Ask your health care provider how often you should have your eyes and teeth checked.  Stay up to date on all vaccines. This information is not intended to replace advice given to you by your health care provider. Make sure you discuss any questions you have with your health care provider. Document Revised: 11/23/2017 Document Reviewed: 11/23/2017 Elsevier Patient Education  2020 [employer].

## 2019-10-25 ENCOUNTER — Other Ambulatory Visit: Payer: Self-pay | Admitting: Emergency Medicine

## 2019-10-25 DIAGNOSIS — E559 Vitamin D deficiency, unspecified: Secondary | ICD-10-CM

## 2019-10-25 MED ORDER — VITAMIN D (ERGOCALCIFEROL) 1.25 MG (50000 UNIT) PO CAPS: ORAL_CAPSULE | ORAL | 0 refills | Status: AC

## 2019-11-20 ENCOUNTER — Ambulatory Visit (HOSPITAL_COMMUNITY): Payer: No Typology Code available for payment source | Attending: Cardiology

## 2019-11-20 ENCOUNTER — Other Ambulatory Visit: Payer: Self-pay

## 2019-11-20 DIAGNOSIS — E669 Obesity, unspecified: Secondary | ICD-10-CM | POA: Insufficient documentation

## 2019-11-20 DIAGNOSIS — Z8249 Family history of ischemic heart disease and other diseases of the circulatory system: Secondary | ICD-10-CM | POA: Diagnosis not present

## 2019-11-20 DIAGNOSIS — I421 Obstructive hypertrophic cardiomyopathy: Secondary | ICD-10-CM | POA: Insufficient documentation

## 2019-11-20 LAB — ECHOCARDIOGRAM COMPLETE
Area-P 1/2: 3.85 cm2
S' Lateral: 3.1 cm

## 2020-01-11 ENCOUNTER — Other Ambulatory Visit: Payer: Self-pay | Admitting: Physician Assistant

## 2020-01-11 DIAGNOSIS — E559 Vitamin D deficiency, unspecified: Secondary | ICD-10-CM

## 2020-05-12 ENCOUNTER — Other Ambulatory Visit: Payer: Self-pay | Admitting: Emergency Medicine

## 2020-10-26 ENCOUNTER — Encounter: Payer: No Typology Code available for payment source | Admitting: Physician Assistant

## 2021-07-22 ENCOUNTER — Emergency Department (HOSPITAL_BASED_OUTPATIENT_CLINIC_OR_DEPARTMENT_OTHER): Payer: No Typology Code available for payment source | Admitting: Radiology

## 2021-07-22 ENCOUNTER — Emergency Department (HOSPITAL_BASED_OUTPATIENT_CLINIC_OR_DEPARTMENT_OTHER): Payer: No Typology Code available for payment source

## 2021-07-22 ENCOUNTER — Encounter (HOSPITAL_BASED_OUTPATIENT_CLINIC_OR_DEPARTMENT_OTHER): Payer: Self-pay

## 2021-07-22 ENCOUNTER — Emergency Department (HOSPITAL_BASED_OUTPATIENT_CLINIC_OR_DEPARTMENT_OTHER)
Admission: EM | Admit: 2021-07-22 | Discharge: 2021-07-22 | Disposition: A | Discharge status: Home / Self Care | Payer: No Typology Code available for payment source | Attending: Emergency Medicine | Admitting: Emergency Medicine

## 2021-07-22 ENCOUNTER — Other Ambulatory Visit: Payer: Self-pay

## 2021-07-22 DIAGNOSIS — R410 Disorientation, unspecified: Secondary | ICD-10-CM | POA: Insufficient documentation

## 2021-07-22 DIAGNOSIS — S53401A Unspecified sprain of right elbow, initial encounter: Secondary | ICD-10-CM | POA: Insufficient documentation

## 2021-07-22 DIAGNOSIS — S39012A Strain of muscle, fascia and tendon of lower back, initial encounter: Secondary | ICD-10-CM | POA: Diagnosis not present

## 2021-07-22 DIAGNOSIS — Y92410 Unspecified street and highway as the place of occurrence of the external cause: Secondary | ICD-10-CM | POA: Diagnosis not present

## 2021-07-22 DIAGNOSIS — S46911A Strain of unspecified muscle, fascia and tendon at shoulder and upper arm level, right arm, initial encounter: Secondary | ICD-10-CM

## 2021-07-22 DIAGNOSIS — S34109A Unspecified injury to unspecified level of lumbar spinal cord, initial encounter: Secondary | ICD-10-CM | POA: Diagnosis present

## 2021-07-22 DIAGNOSIS — S161XXA Strain of muscle, fascia and tendon at neck level, initial encounter: Secondary | ICD-10-CM | POA: Diagnosis not present

## 2021-07-22 NOTE — ED Notes (Signed)
Pt observed ambulating to room from waiting room; gait steady - no obvious distress.  2 visitors with patient.  ?

## 2021-07-22 NOTE — ED Notes (Signed)
Pt agreeable with d/c plan as discussed by provider- this nurse has verbally reinforced d/c instructions and provided pt with written copy- pt acknowledges verbal understanding and denies any additional questions, concerns, needs- pt ambulatory at discharge with steady gait; no distress- escorted home with family   ?
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
